# Patient Record
Sex: Female | Born: 1980 | Race: Black or African American | Hispanic: No | Marital: Single | Smoking: Never smoker
Health system: Southern US, Community
[De-identification: ages and names within clinical notes are randomized; demographics above are authoritative.]

## PROBLEM LIST (undated history)

## (undated) ENCOUNTER — Ambulatory Visit: Payer: BC Managed Care – PPO | Source: Home / Self Care

## (undated) ENCOUNTER — Inpatient Hospital Stay (HOSPITAL_COMMUNITY): Payer: Self-pay

## (undated) DIAGNOSIS — K432 Incisional hernia without obstruction or gangrene: Secondary | ICD-10-CM

## (undated) DIAGNOSIS — IMO0002 Reserved for concepts with insufficient information to code with codable children: Secondary | ICD-10-CM

## (undated) DIAGNOSIS — Z8719 Personal history of other diseases of the digestive system: Secondary | ICD-10-CM

## (undated) DIAGNOSIS — K5730 Diverticulosis of large intestine without perforation or abscess without bleeding: Secondary | ICD-10-CM

## (undated) DIAGNOSIS — F329 Major depressive disorder, single episode, unspecified: Secondary | ICD-10-CM

## (undated) DIAGNOSIS — F419 Anxiety disorder, unspecified: Secondary | ICD-10-CM

## (undated) DIAGNOSIS — J189 Pneumonia, unspecified organism: Secondary | ICD-10-CM

## (undated) DIAGNOSIS — N3281 Overactive bladder: Secondary | ICD-10-CM

## (undated) DIAGNOSIS — K5792 Diverticulitis of intestine, part unspecified, without perforation or abscess without bleeding: Secondary | ICD-10-CM

## (undated) HISTORY — PX: CHOLECYSTECTOMY: SHX55

## (undated) HISTORY — PX: HERNIA REPAIR: SHX51

## (undated) HISTORY — DX: Pneumonia, unspecified organism: J18.9

## (undated) HISTORY — DX: Reserved for concepts with insufficient information to code with codable children: IMO0002

## (undated) HISTORY — DX: Anxiety disorder, unspecified: F41.9

## (undated) HISTORY — PX: COLPOSCOPY VULVA W/ BIOPSY: SUR282

---

## 2002-03-11 ENCOUNTER — Emergency Department (HOSPITAL_COMMUNITY): Admission: EM | Admit: 2002-03-11 | Discharge: 2002-03-12 | Payer: Self-pay | Admitting: Emergency Medicine

## 2002-12-22 ENCOUNTER — Emergency Department (HOSPITAL_COMMUNITY): Admission: EM | Admit: 2002-12-22 | Discharge: 2002-12-22 | Payer: Self-pay | Admitting: Emergency Medicine

## 2003-01-02 ENCOUNTER — Emergency Department (HOSPITAL_COMMUNITY): Admission: EM | Admit: 2003-01-02 | Discharge: 2003-01-02 | Payer: Self-pay | Admitting: Emergency Medicine

## 2003-11-10 ENCOUNTER — Inpatient Hospital Stay (HOSPITAL_COMMUNITY): Admission: AD | Admit: 2003-11-10 | Discharge: 2003-11-10 | Payer: Self-pay | Admitting: Obstetrics and Gynecology

## 2003-12-18 ENCOUNTER — Inpatient Hospital Stay (HOSPITAL_COMMUNITY): Admission: AD | Admit: 2003-12-18 | Discharge: 2003-12-20 | Payer: Self-pay | Admitting: Obstetrics and Gynecology

## 2003-12-29 ENCOUNTER — Ambulatory Visit: Admission: RE | Admit: 2003-12-29 | Discharge: 2003-12-29 | Payer: Self-pay | Admitting: Obstetrics and Gynecology

## 2004-01-02 ENCOUNTER — Ambulatory Visit: Admission: RE | Admit: 2004-01-02 | Discharge: 2004-01-02 | Payer: Self-pay | Admitting: Obstetrics and Gynecology

## 2004-01-12 ENCOUNTER — Encounter: Admission: RE | Admit: 2004-01-12 | Discharge: 2004-02-11 | Payer: Self-pay | Admitting: Obstetrics and Gynecology

## 2004-01-31 ENCOUNTER — Other Ambulatory Visit: Admission: RE | Admit: 2004-01-31 | Discharge: 2004-01-31 | Payer: Self-pay | Admitting: Obstetrics and Gynecology

## 2004-03-11 ENCOUNTER — Encounter: Admission: RE | Admit: 2004-03-11 | Discharge: 2004-04-10 | Payer: Self-pay | Admitting: Obstetrics and Gynecology

## 2004-05-11 ENCOUNTER — Encounter: Admission: RE | Admit: 2004-05-11 | Discharge: 2004-06-10 | Payer: Self-pay | Admitting: Obstetrics and Gynecology

## 2004-07-11 ENCOUNTER — Encounter: Admission: RE | Admit: 2004-07-11 | Discharge: 2004-08-10 | Payer: Self-pay | Admitting: Obstetrics and Gynecology

## 2004-08-11 ENCOUNTER — Encounter: Admission: RE | Admit: 2004-08-11 | Discharge: 2004-09-10 | Payer: Self-pay | Admitting: Obstetrics and Gynecology

## 2004-10-11 ENCOUNTER — Encounter: Admission: RE | Admit: 2004-10-11 | Discharge: 2004-11-10 | Payer: Self-pay | Admitting: Obstetrics and Gynecology

## 2008-03-31 DIAGNOSIS — J189 Pneumonia, unspecified organism: Secondary | ICD-10-CM

## 2008-03-31 HISTORY — DX: Pneumonia, unspecified organism: J18.9

## 2008-04-01 ENCOUNTER — Emergency Department (HOSPITAL_COMMUNITY): Admission: EM | Admit: 2008-04-01 | Discharge: 2008-04-01 | Payer: Self-pay | Admitting: Emergency Medicine

## 2011-04-18 NOTE — H&P (Signed)
NAME:  Alexa Gallagher, Alexa Gallagher                        ACCOUNT NO.:  [id number]   MEDICAL RECORD NO.:  [id number]                   PATIENT TYPE:  INP   LOCATION:  9161                                 FACILITY:  [facility]   PHYSICIAN:  Osborn Coho, M.D.                DATE OF BIRTH:  [birth year]   DATE OF ADMISSION:  12/18/2003  DATE OF DISCHARGE:                                HISTORY & PHYSICAL   Ms. Alexa Gallagher is a 30 year old gravida 1, para 0 at 40-3/7 weeks, estimated date  of delivery December 15, 2003 by dates, confirmed by early first trimester  ultrasound at 9 weeks, 6 days.   She presents with contractions throughout the day yesterday, increasing in  frequency and intensity throughout the night.  Contractions are now every 2  to 4 minutes.  The patient reports positive fetal movements and a mucoid  discharge with bloody show but no active bleeding, no rupture of membranes.  She denies any headaches, visual changes or epigastric pain.  Her pregnancy  has been followed by the CNM Service at [facility] and is remarkable for:  1. First trimester x-ray exposure.  2. Narrow outlet.  3. Positive Coxsackie titers.  4. Group B strep negative.   This patient was initially evaluated at the office of [facility] on Apr 21, 2003  at [redacted] weeks gestation.  EDC determined by LMP dates and confirmed with first  trimester ultrasound.  Follow up ultrasounds have all shown appropriate growth.  This patient's  prenatal course has been complicated by early shortness of breath and chest  pain.  She had a cardiac evaluation with a normal EKG, normal chest x-ray  which was all within normal limits.  She has had complaints of constipation  throughout her pregnancy.  Ultrasound evaluation at 18 weeks showed normal  growth with a left ventricular EIF at 28 weeks.  Ultrasound again showed  appropriate growth and fluid, the left ventricular EIF was no longer noted  at that time.  In her pregnancy at 28 weeks the patient was exposed  to hand,  foot and mouth disease, Coxsackie titers were positive.  Ultrasound for  growth following positive titers was within the 60 to 70th percentile with  normal fluid.  At 32 weeks patient was evaluated for preterm contractions.  Her cervix was long and closed at that time; fetal fibrinonectin, GC and  Chlamydia were all negative at 36 weeks.  Culture of the vaginal tract is  negative for group B strep.  The patient has gained 51 pounds with this  pregnancy.  She has been normotensive until the last visit.  The patient and  her mother state that her blood pressure was up.  Today on admission her  blood pressure was 144/95.  PIH labs and urinalysis will be obtained to  evaluate for preeclampsia.   PRENATAL LAB WORK:  Hemoglobin and hematocrit 11.8 and 34.2, platelets  256,000.  Blood type and Rh  A positive, antibody screen negative.  Sickle cell trait negative.  VDRL  nonreactive. Rubella immune.  Hepatitis B surface antigen negative.  HIV  declined.  GC and Chlamydia negative.  Cystic fibrosis testing negative.  Quad screen declined.  At 28 weeks 1 hour Glucola within normal limits.  Hemoglobin 11.1.  Coxsackie titers at term were found to be positive.   OB HISTORY:  Is the present pregnancy.   PAST MEDICAL HISTORY:  Unremarkable.   SURGICAL HISTORY:  Wisdom teeth.   FAMILY HISTORY:  Paternal grandmother with a history of heart disease.  The  patient's father and paternal grandmother with a history of chronic  hypertension.  Mother has a question of a cancer history.  Maternal aunt  with bipolar disease.   GENETIC HISTORY:  The patient's father and first cousin with sickle cell  trait, and paternal grandfather with sickle cell disease.  The patient's  maternal grandmother was a twin.   SOCIAL HISTORY:  Ms. Alexa Gallagher is a 30 year old, married multiracial female.  Her husband Magda Kiel is involved and supportive.  He is currently off  shore in the [employer].  A phone call is being  made for his return as she is  admitted in labor.  The patient denies the use of tobacco, alcohol or  illicit drugs.   ALLERGIES:  There are no known drug allergies.   REVIEW OF SYSTEMS:  The patient is typical of one with a uterine pregnancy  at term.  She is contracting regularly every two to four minutes.  She is  noted to have increased blood pressure and swelling.  She is admitted in  early labor and to be evaluated for preeclampsia.   PHYSICAL EXAMINATION:  VITAL SIGNS:  The patient is afebrile.  Her blood  pressure is 144/9, pulse 110 and respirations 20.  HEENT:  Unremarkable.  HEART:  Regular rate and rhythm.  LUNGS:  Clear.  ABDOMEN:  Gravid in its contour.  Uterine fundus was noted to extend 39 cm  above the level of the pubic symphysis.  Thayer Ohm maneuvers finds the infant  to be in a longitudinal lie, cephalic presentation and the estimated fetal  weight is 7.5 to 8 pounds.  The baseline of the fetal heart rate monitor is  140's, there is averaged long term variability.  There are accelerations  noted .  Though fetal heart rate remains reactive, there are occasional mild  variables noted and late decelerations though not with all contractions.  The patient is contracting every 2 to 4 minutes.  CERVIX:  Digital exam of the cervix finds it to be 1 to 2 cm dilated, 70%  effaced with the cephalic presenting part at -2 station.  EXTREMITIES:  Show 1+ edema.  Deep tendon reflexes are 2+ with no clonus.   ASSESSMENT:  Intrauterine pregnancy at term, early labor.   PLAN:  Admit per Dr. Osborn Coho.  Routine CNM orders.  Obtain PIH labs  and urinalysis to evaluate for preeclampsia.     Rica Koyanagi, C.N.M.               Osborn Coho, M.D.    SDM/MEDQ  D:  12/18/2003  T:  12/18/2003  Job:  [id number]

## 2011-08-27 LAB — URINALYSIS, ROUTINE W REFLEX MICROSCOPIC
Bilirubin Urine: NEGATIVE
Glucose, UA: NEGATIVE
Hgb urine dipstick: NEGATIVE
Ketones, ur: NEGATIVE
Nitrite: NEGATIVE
Protein, ur: NEGATIVE
Specific Gravity, Urine: 1.024
Urobilinogen, UA: 1
pH: 8

## 2011-12-02 NOTE — L&D Delivery Note (Signed)
Patient was C/C/0  and pushed for 60 minutes with epidural.   NSVD of  female infant, Apgars 9/9, weight pending.   The patient had no lacerations. Fundus was firm. EBL was expected. Placenta was delivered intact, true knot noted in cord. Vagina was clear.  Baby was vigorous to bedside.  Alexa Gallagher

## 2012-03-02 ENCOUNTER — Inpatient Hospital Stay (HOSPITAL_COMMUNITY)
Admission: AD | Admit: 2012-03-02 | Discharge: 2012-03-02 | Disposition: A | Discharge status: Home / Self Care | Source: Ambulatory Visit | Attending: Family Medicine | Admitting: Family Medicine

## 2012-03-02 ENCOUNTER — Encounter (HOSPITAL_COMMUNITY): Payer: Self-pay | Admitting: *Deleted

## 2012-03-02 DIAGNOSIS — O26899 Other specified pregnancy related conditions, unspecified trimester: Secondary | ICD-10-CM

## 2012-03-02 DIAGNOSIS — Z3490 Encounter for supervision of normal pregnancy, unspecified, unspecified trimester: Secondary | ICD-10-CM

## 2012-03-02 DIAGNOSIS — R109 Unspecified abdominal pain: Secondary | ICD-10-CM

## 2012-03-02 DIAGNOSIS — O99891 Other specified diseases and conditions complicating pregnancy: Secondary | ICD-10-CM | POA: Insufficient documentation

## 2012-03-02 DIAGNOSIS — N390 Urinary tract infection, site not specified: Secondary | ICD-10-CM | POA: Insufficient documentation

## 2012-03-02 DIAGNOSIS — O2390 Unspecified genitourinary tract infection in pregnancy, unspecified trimester: Secondary | ICD-10-CM | POA: Insufficient documentation

## 2012-03-02 DIAGNOSIS — Z1389 Encounter for screening for other disorder: Secondary | ICD-10-CM

## 2012-03-02 HISTORY — DX: Overactive bladder: N32.81

## 2012-03-02 LAB — WET PREP, GENITAL
Clue Cells Wet Prep HPF POC: NONE SEEN
Trich, Wet Prep: NONE SEEN
Yeast Wet Prep HPF POC: NONE SEEN

## 2012-03-02 LAB — URINALYSIS, ROUTINE W REFLEX MICROSCOPIC
Bilirubin Urine: NEGATIVE
Glucose, UA: NEGATIVE mg/dL
Ketones, ur: NEGATIVE mg/dL
Nitrite: NEGATIVE
Protein, ur: NEGATIVE mg/dL
Specific Gravity, Urine: 1.025 (ref 1.005–1.030)
Urobilinogen, UA: 0.2 mg/dL (ref 0.0–1.0)
pH: 6.5 (ref 5.0–8.0)

## 2012-03-02 LAB — URINE MICROSCOPIC-ADD ON

## 2012-03-02 LAB — POCT PREGNANCY, URINE: Preg Test, Ur: POSITIVE — AB

## 2012-03-02 MED ORDER — CEPHALEXIN 500 MG PO CAPS: 500.0000 mg | ORAL_CAPSULE | Freq: Four times a day (QID) | ORAL | Status: AC

## 2012-03-02 MED ORDER — FLUCONAZOLE 150 MG PO TABS: 150.0000 mg | ORAL_TABLET | Freq: Once | ORAL | Status: AC

## 2012-03-02 NOTE — MAU Provider Note (Signed)
History   Pt presents today c/o lower abd cramping that comes and goes. She also c/o vag dc with odor, urinary frequency and pressure. She thinks she may have a UTI and she thinks she is pregnant. She denies fever, vag bleeding, or severe abd pain.  CSN: [id number]  Arrival date and time: 03/02/12 1440   First Provider Initiated Contact with Patient 03/02/12 1540      Chief Complaint  Patient presents with  . Abdominal Pain   HPI  OB History    Grav Para Term Preterm Abortions TAB SAB Ect Mult Living   2 1 1  0 0 0 0 0 0 1      Past Medical History  Diagnosis Date  . Overactive bladder     Past Surgical History  Procedure Date  . Cholecystectomy     Family History  Problem Relation Age of Onset  . Anesthesia problems Neg Hx     History  Substance Use Topics  . Smoking status: Never Smoker   . Smokeless tobacco: Not on file  . Alcohol Use: No    Allergies: No Known Allergies  No prescriptions prior to admission    Review of Systems  Constitutional: Negative for fever and chills.  Eyes: Negative for blurred vision and double vision.  Respiratory: Negative for cough, hemoptysis, sputum production, shortness of breath and wheezing.   Cardiovascular: Negative for chest pain and palpitations.  Gastrointestinal: Positive for abdominal pain. Negative for nausea, vomiting, diarrhea and constipation.  Genitourinary: Positive for urgency and frequency. Negative for dysuria, hematuria and flank pain.  Neurological: Negative for dizziness and headaches.  Psychiatric/Behavioral: Negative for depression and suicidal ideas.   Physical Exam   Blood pressure 124/86, pulse 95, temperature 98.1 F (36.7 C), temperature source Oral, resp. rate 18, height 5\' 8"  (1.727 m), weight 179 lb 12.8 oz (81.557 kg), last menstrual period 12/24/2011, SpO2 100.00%.  Physical Exam  Nursing note and vitals reviewed. Constitutional: She is oriented to person, place, and time. She appears  well-developed and well-nourished. No distress.  HENT:  Head: Normocephalic and atraumatic.  Eyes: EOM are normal. Pupils are equal, round, and reactive to light.  GI: Soft. She exhibits no distension and no mass. There is no tenderness. There is no rebound and no guarding.  Genitourinary: No bleeding around the vagina. Vaginal discharge found.       Cervix Lg/closed. Thin, greenish vag dc present in the vault.   Neurological: She is alert and oriented to person, place, and time.  Skin: Skin is warm and dry. She is not diaphoretic.  Psychiatric: She has a normal mood and affect. Her behavior is normal. Judgment and thought content normal.    MAU Course  Procedures  Wet prep and GC/Chlamydia cultures done.  Bedside US shows single IUP at 10.0wks with good cardiac activity.  Results for orders placed during the hospital encounter of 03/02/12 (from the past 72 hour(s))  URINALYSIS, ROUTINE W REFLEX MICROSCOPIC     Status: Abnormal   Collection Time   03/02/12  2:52 PM      Component Value Range Comment   Color, Urine YELLOW  YELLOW     APPearance CLEAR  CLEAR     Specific Gravity, Urine 1.025  1.005 - 1.030     pH 6.5  5.0 - 8.0     Glucose, UA NEGATIVE  NEGATIVE (mg/dL)    Hgb urine dipstick TRACE (*) NEGATIVE     Bilirubin Urine NEGATIVE  NEGATIVE  Ketones, ur NEGATIVE  NEGATIVE (mg/dL)    Protein, ur NEGATIVE  NEGATIVE (mg/dL)    Urobilinogen, UA 0.2  0.0 - 1.0 (mg/dL)    Nitrite NEGATIVE  NEGATIVE     Leukocytes, UA SMALL (*) NEGATIVE    URINE MICROSCOPIC-ADD ON     Status: Abnormal   Collection Time   03/02/12  2:52 PM      Component Value Range Comment   Squamous Epithelial / LPF MANY (*) RARE     WBC, UA 3-6  <3 (WBC/hpf)    Bacteria, UA MANY (*) RARE    POCT PREGNANCY, URINE     Status: Abnormal   Collection Time   03/02/12  3:09 PM      Component Value Range Comment   Preg Test, Ur POSITIVE (*) NEGATIVE    WET PREP, GENITAL     Status: Abnormal   Collection Time    03/02/12  3:55 PM      Component Value Range Comment   Yeast Wet Prep HPF POC NONE SEEN  NONE SEEN     Trich, Wet Prep NONE SEEN  NONE SEEN     Clue Cells Wet Prep HPF POC NONE SEEN  NONE SEEN     WBC, Wet Prep HPF POC MODERATE (*) NONE SEEN  MODERATE BACTERIA SEEN   Urine culture sent.  Assessment and Plan  Pain in preg: discussed with pt at length. Will empirically tx for UTI with keflex. Will await cultures. Discussed diet, activity, risks, and precautions.   Clinton Gallant. Urania Pearlman III, DrHSc, MPAS, PA-C  03/02/2012, 3:53 PM

## 2012-03-02 NOTE — MAU Note (Signed)
Pt in c/o bad bilateral lower abdominal sharp pain that started a couple weeks ago, just getting worse.  Reports frequency of urination, hx of frequent utis, denies any burning with urination.   Reports lower back aching with occasional sharp pains.  LMP 12/24/11.  + UPT beginning of March.  Denies any bleeding.  Reports a clear discharge with odor.

## 2012-03-02 NOTE — Discharge Instructions (Signed)
Abdominal Pain During Pregnancy Belly (abdominal) pain is common during pregnancy. Most of the time, it is not a serious problem. Other times, it can be a sign that something is wrong with the pregnancy. Always tell your doctor if you have belly pain. HOME CARE For mild pain:  Do not have sex (intercourse) or put anything in your vagina until you feel better.   Rest until your pain stops. If your pain lasts longer than 1 hour, call your doctor.   Drink clear fluids if you feel sick to your stomach (nauseous).   Do not eat solid food until you feel better.   Only take medicine as told by your doctor.   Keep all doctor visits as told.  GET HELP RIGHT AWAY IF:   You are bleeding, leaking fluid, or pieces of tissue come out of your vagina.   You have more pain or cramping.   You keep throwing up (vomiting).   You have pain when you pee (urinate) or have blood in your pee.   You have a fever.   You do not feel your baby moving as much.   You feel very weak or feel like passing out.   You have trouble breathing, with or without belly pain.   You have a very bad headache and belly pain.   You have fluid leaking from your vagina and belly pain.   You keep having watery poop (diarrhea).   Your belly pain does not go away after resting, or the pain gets worse.  MAKE SURE YOU:   Understand these instructions.   Will watch your condition.   Will get help right away if you are not doing well or get worse.  Document Released: 11/05/2009 Document Revised: 11/06/2011 Document Reviewed: 06/13/2011 [facility] Patient Information 2012 [place], [place].Urinary Tract Infection in Pregnancy A urinary tract infection (UTI) is a bacterial infection of the urinary tract. Infection of the urinary tract can include the ureters, kidneys (pyelonephritis), bladder (cystitis), and urethra (urethritis). All pregnant women should be screened for bacteria in the urinary tract. Identifying and treating a  UTI will decrease the risk of preterm labor and developing more serious infections in both the mother and baby. CAUSES Bacteria germs cause almost all UTIs. There are many factors that can increase your chances of getting a UTI during pregnancy. These include:  Having a short urethra.   Poor toilet and hygiene habits.   Sexual intercourse.   Blockage of urine along the urinary tract.   Problems with the pelvic muscles or nerves.   Diabetes.   Obesity.   Bladder problems after having several children.   Previous history of UTI.  SYMPTOMS   Pain, burning, or a stinging feeling when urinating.   Suddenly feeling the need to urinate right away (urgency).   Loss of bladder control (urinary incontinence).   Frequent urination, more than is common with pregnancy.   Lower abdominal or back discomfort.   Bad smelling urine.   Cloudy urine.   Blood in the urine (hematuria).   Fever.  When the kidneys are infected, the symptoms may be:  Back pain.   Flank pain on the right side more so than the left.   Fever.   Chills.   Nausea.   Vomiting.  DIAGNOSIS   Urine tests.   Additional tests and procedures may include:   Ultrasound of the kidneys, ureters, bladder, and urethra.   Looking in the bladder with a lighted tube (cystoscopy).   Certain  X-ray studies only when absolutely necessary.  Finding out the results of your test Ask when your test results will be ready. Make sure you get your test results. TREATMENT  Antibiotic medicine by mouth.   Antibiotics given through the vein (intravenously), if needed.  HOME CARE INSTRUCTIONS   Take your antibiotics as directed. Finish them even if you start to feel better. Only take medicine as directed by your caregiver.   Drink enough fluids to keep your urine clear or pale yellow.   Do not have sexual intercourse until the infection is gone and your caregiver says it is okay.   Make sure you are tested for UTIs  throughout your pregnancy if you get one. These infections often come back.  Preventing a UTI in the future:  Practice good toilet habits. Always wipe from front to back. Use the tissue only once.   Do not hold your urine. Empty your bladder as soon as possible when the urge comes.   Do not douche or use deodorant sprays.   Wash with soap and warm water around the genital area and the anus.   Empty your bladder before and after sexual intercourse.   Wear underwear with a cotton crotch.   Avoid caffeine and carbonated drinks. They can irritate the bladder.   Drink cranberry juice or take cranberry pills. This may decrease the risk of getting a UTI.   Do not drink alcohol.   Keep all your appointments and tests as scheduled.  SEEK MEDICAL CARE IF:   Your symptoms get worse.   You are still having fevers 2 or more days after treatment begins.   You develop a rash.   You feel that you are having problems with medicines prescribed.   You develop abnormal vaginal discharge.  SEEK IMMEDIATE MEDICAL CARE IF:   You develop back or flank pain.   You develop chills.   You have blood in your urine.   You develop nausea and vomiting.   You develop contractions of your uterus.   You have a gush of fluid from the vagina.  MAKE SURE YOU:   Understand these instructions.   Will watch your condition.   Will get help right away if you are not doing well or get worse.  Document Released: 03/14/2011 Document Revised: 11/06/2011 Document Reviewed: 03/14/2011 [facility] Patient Information 2012 [place], [place].

## 2012-03-02 NOTE — MAU Provider Note (Signed)
Chart reviewed and agree with management and plan.  

## 2012-03-03 LAB — GC/CHLAMYDIA PROBE AMP, GENITAL
Chlamydia, DNA Probe: NEGATIVE
GC Probe Amp, Genital: NEGATIVE

## 2012-03-03 LAB — URINE CULTURE
Colony Count: >=100000
Culture  Setup Time: 201304022059

## 2012-03-22 LAB — OB RESULTS CONSOLE RPR: RPR: NONREACTIVE

## 2012-03-22 LAB — OB RESULTS CONSOLE ABO/RH: RH Type: POSITIVE

## 2012-03-22 LAB — OB RESULTS CONSOLE HEPATITIS B SURFACE ANTIGEN: Hepatitis B Surface Ag: NEGATIVE

## 2012-03-22 LAB — OB RESULTS CONSOLE HIV ANTIBODY (ROUTINE TESTING): HIV: NONREACTIVE

## 2012-03-22 LAB — OB RESULTS CONSOLE RUBELLA ANTIBODY, IGM: Rubella: IMMUNE

## 2012-03-22 LAB — OB RESULTS CONSOLE ANTIBODY SCREEN: Antibody Screen: NEGATIVE

## 2012-07-19 ENCOUNTER — Inpatient Hospital Stay (HOSPITAL_COMMUNITY)
Admission: AD | Admit: 2012-07-19 | Discharge: 2012-07-19 | Disposition: A | Discharge status: Hospice | Payer: Medicaid Other | Source: Ambulatory Visit | Attending: Obstetrics & Gynecology | Admitting: Obstetrics & Gynecology

## 2012-07-19 DIAGNOSIS — O99891 Other specified diseases and conditions complicating pregnancy: Secondary | ICD-10-CM | POA: Insufficient documentation

## 2012-07-19 DIAGNOSIS — K5900 Constipation, unspecified: Secondary | ICD-10-CM | POA: Insufficient documentation

## 2012-07-19 DIAGNOSIS — O0930 Supervision of pregnancy with insufficient antenatal care, unspecified trimester: Secondary | ICD-10-CM | POA: Insufficient documentation

## 2012-07-19 DIAGNOSIS — R109 Unspecified abdominal pain: Secondary | ICD-10-CM | POA: Insufficient documentation

## 2012-07-19 LAB — URINALYSIS, ROUTINE W REFLEX MICROSCOPIC
Bilirubin Urine: NEGATIVE
Glucose, UA: NEGATIVE mg/dL
Hgb urine dipstick: NEGATIVE
Ketones, ur: NEGATIVE mg/dL
Leukocytes, UA: NEGATIVE
Nitrite: NEGATIVE
Protein, ur: NEGATIVE mg/dL
Specific Gravity, Urine: 1.025 (ref 1.005–1.030)
Urobilinogen, UA: 1 mg/dL (ref 0.0–1.0)
pH: 7 (ref 5.0–8.0)

## 2012-07-19 MED ORDER — MAGNESIUM HYDROXIDE 400 MG/5ML PO SUSP: 30.0000 mL | Freq: Every day | ORAL | Status: AC | PRN

## 2012-07-19 MED ORDER — POLYETHYLENE GLYCOL 3350 17 G PO PACK: 17.0000 g | PACK | Freq: Two times a day (BID) | ORAL | Status: DC

## 2012-07-19 NOTE — MAU Note (Signed)
Pt c/o constipation and says she has not had a decent sized BM in one week.  Takes miralax daily for the past 5 days and has very sm bm's daily.  Also c/o intermittant RLQ pain since Saturday.  Has not taken anything for the pain.

## 2012-07-19 NOTE — MAU Provider Note (Signed)
History     CSN: [id number]  Arrival date and time: 07/19/12 1747   First Provider Initiated Contact with Patient 07/19/12 1914      Chief Complaint  Patient presents with  . Constipation  . Abdominal Pain   Pt is a 31 yo G21P1001 female here at 29w and 5 days with constipation, also with no current prenatal provider since [redacted] weeks gestation.  HPI Comments: Pt states stool has in the past week had small stools that still leave her feeling full.  She is also increasingly gassy.    Constipation This is a new problem. The current episode started in the past 7 days. The problem is unchanged. Her stool frequency is 1 time per day. The stool is described as firm. Associated symptoms include abdominal pain, bloating and flatus. Pertinent negatives include no fever. She has tried laxatives for the symptoms. The treatment provided no relief.  Abdominal Pain Associated symptoms include constipation and flatus. Pertinent negatives include no fever or hematuria.   Pertinent prenatal history:  Pt states she had prenatal care until [redacted] weeks gestation, at which point her insurance stopped.  Has applied for pregnancy Medicaid.  Denies hypertension in pregnancy.  Has not had glucose test during this pregnancy.  Denies any complications.    Previous delivery NSVD with no complications other than meconium during delivery.  Past Medical History  Diagnosis Date  . Overactive bladder    Meds: PNV  Past Surgical History  Procedure Date  . Cholecystectomy     Family History  Problem Relation Age of Onset  . Anesthesia problems Neg Hx     History  Substance Use Topics  . Smoking status: Never Smoker   . Smokeless tobacco: Not on file  . Alcohol Use: No    Allergies: No Known Allergies  No prescriptions prior to admission    Review of Systems  Constitutional: Negative for fever.  Respiratory: Negative for cough and shortness of breath.   Cardiovascular: Negative for chest pain.    Gastrointestinal: Positive for abdominal pain, constipation, bloating and flatus. Negative for blood in stool.  Genitourinary: Negative for hematuria.   Physical Exam   Blood pressure 113/67, pulse 84, temperature 98.7 F (37.1 C), temperature source Oral, resp. rate 18, height 5\' 8"  (1.727 m), weight 93.985 kg (207 lb 3.2 oz), last menstrual period 12/24/2011, SpO2 100.00%.  Physical Exam  Constitutional: She is oriented to person, place, and time. She appears well-developed and well-nourished. No distress.  HENT:  Head: Normocephalic and atraumatic.  Eyes: EOM are normal. Right eye exhibits no discharge. Left eye exhibits no discharge.  Neck: Normal range of motion. Neck supple.  Cardiovascular: Normal rate, regular rhythm and normal heart sounds.   Respiratory: Effort normal and breath sounds normal. No respiratory distress.  GI: Soft. She exhibits no distension.       Abdomen mildly tender in RLQ, pt states similar to her "ovary" pain prior to pregnancy.  Musculoskeletal: Normal range of motion.  Neurological: She is alert and oriented to person, place, and time.  Skin: Skin is warm and dry. No rash noted. She is not diaphoretic. No erythema.  Psychiatric: She has a normal mood and affect. Her behavior is normal.   Cervical check per nursing: fingertip, external os closed, internal os long.  MAU Course  Procedures  MDM  Assessment and Plan  Pt is a 31 yo G2P1001 female here at 29w and 5 days with constipation, also with no current prenatal provider since  [redacted] weeks gestation.  # Constipation - ongoing 7 days despite miralax daily for 5 days. - Increase miralax to BID - If no improvement in 1 day, try milk of magnesia x 1 day - If no improvement, return to clinic for enema   # Pregnancy - Pt with no prenatal care to follow-up with [facility], information given - Abdominal pain likely not due to labor, as os is closed  Simone Curia 07/19/2012,  8:47 PM

## 2012-07-19 NOTE — MAU Note (Signed)
Patient states she has been constipated for a while and taking stool softners without much result. Now having abdominal pain and "ovary" pain. Patient reports good fetal movement, no bleeding or leaking. Patient has been getting care in [place] but has not see a MD in 2 months. Husband was with [employer] and was downsized and now has no insurance.

## 2012-07-20 ENCOUNTER — Telehealth: Payer: Self-pay | Admitting: Obstetrics & Gynecology

## 2012-07-20 NOTE — Telephone Encounter (Signed)
Spoke with patient about going to health dept. Patient stated she understood.

## 2012-07-21 NOTE — MAU Provider Note (Signed)
I was consulted on the POC for this patient and agree with above.  [place], CNM 07/21/2012 8:01 AM

## 2012-08-03 ENCOUNTER — Encounter: Payer: Self-pay | Admitting: Obstetrics & Gynecology

## 2012-08-03 ENCOUNTER — Ambulatory Visit (INDEPENDENT_AMBULATORY_CARE_PROVIDER_SITE_OTHER): Admitting: Obstetrics & Gynecology

## 2012-08-03 VITALS — BP 107/75 | Wt 208.0 lb

## 2012-08-03 DIAGNOSIS — Z3480 Encounter for supervision of other normal pregnancy, unspecified trimester: Secondary | ICD-10-CM

## 2012-08-03 DIAGNOSIS — Z23 Encounter for immunization: Secondary | ICD-10-CM

## 2012-08-03 LAB — CBC
HCT: 33.7 % — ABNORMAL LOW (ref 36.0–46.0)
Hemoglobin: 11.5 g/dL — ABNORMAL LOW (ref 12.0–15.0)
MCH: 30.7 pg (ref 26.0–34.0)
MCHC: 34.1 g/dL (ref 30.0–36.0)
MCV: 90.1 fL (ref 78.0–100.0)
Platelets: 247 10*3/uL (ref 150–400)
RBC: 3.74 MIL/uL — ABNORMAL LOW (ref 3.87–5.11)
RDW: 12.5 % (ref 11.5–15.5)
WBC: 6.7 10*3/uL (ref 4.0–10.5)

## 2012-08-03 LAB — OB RESULTS CONSOLE HIV ANTIBODY (ROUTINE TESTING): HIV: NONREACTIVE

## 2012-08-03 LAB — OB RESULTS CONSOLE RPR: RPR: NONREACTIVE

## 2012-08-03 NOTE — Progress Notes (Signed)
Mrs. Alexa Gallagher is here for her first visit here. She did get PNC until 20 weeks in [facility] (Her husband was in the [employer] then). She had declined the Quad screen there. Her AST was slightly elevated so I will recheck it today. She has had intermittent vaginal pains since [redacted] weeks EGA. She denies VB, ROM, or CTXs. She reports good FM. I will get her 28 week labs/vaccine today.

## 2012-08-04 LAB — GLUCOSE TOLERANCE, 1 HOUR (50G) W/O FASTING: Glucose, 1 Hour GTT: 76 mg/dL (ref 70–140)

## 2012-08-04 LAB — HIV ANTIBODY (ROUTINE TESTING W REFLEX): HIV: NONREACTIVE

## 2012-08-04 LAB — RPR

## 2012-08-04 LAB — AST: AST: 21 U/L (ref 0–37)

## 2012-08-04 LAB — ALT: ALT: 17 U/L (ref 0–35)

## 2012-08-06 ENCOUNTER — Ambulatory Visit (HOSPITAL_COMMUNITY)
Admission: RE | Admit: 2012-08-06 | Discharge: 2012-08-06 | Disposition: A | Discharge status: Home / Self Care | Source: Ambulatory Visit | Attending: Obstetrics & Gynecology | Admitting: Obstetrics & Gynecology

## 2012-08-06 DIAGNOSIS — O0930 Supervision of pregnancy with insufficient antenatal care, unspecified trimester: Secondary | ICD-10-CM | POA: Insufficient documentation

## 2012-08-06 DIAGNOSIS — Z3480 Encounter for supervision of other normal pregnancy, unspecified trimester: Secondary | ICD-10-CM

## 2012-08-06 DIAGNOSIS — Z1389 Encounter for screening for other disorder: Secondary | ICD-10-CM | POA: Insufficient documentation

## 2012-08-06 DIAGNOSIS — O358XX0 Maternal care for other (suspected) fetal abnormality and damage, not applicable or unspecified: Secondary | ICD-10-CM | POA: Insufficient documentation

## 2012-08-06 DIAGNOSIS — Z363 Encounter for antenatal screening for malformations: Secondary | ICD-10-CM | POA: Insufficient documentation

## 2012-08-09 ENCOUNTER — Encounter: Payer: Self-pay | Admitting: Obstetrics & Gynecology

## 2012-08-09 DIAGNOSIS — Z3480 Encounter for supervision of other normal pregnancy, unspecified trimester: Secondary | ICD-10-CM | POA: Insufficient documentation

## 2012-08-17 ENCOUNTER — Encounter: Admitting: Obstetrics & Gynecology

## 2012-08-24 ENCOUNTER — Encounter: Admitting: Obstetrics & Gynecology

## 2012-08-30 LAB — OB RESULTS CONSOLE GBS: GBS: NEGATIVE

## 2012-09-28 ENCOUNTER — Encounter (HOSPITAL_COMMUNITY): Payer: Self-pay | Admitting: *Deleted

## 2012-09-28 ENCOUNTER — Telehealth (HOSPITAL_COMMUNITY): Payer: Self-pay | Admitting: *Deleted

## 2012-09-28 NOTE — Telephone Encounter (Signed)
Preadmission screen  

## 2012-10-05 ENCOUNTER — Encounter (HOSPITAL_COMMUNITY)

## 2012-10-05 ENCOUNTER — Inpatient Hospital Stay (HOSPITAL_COMMUNITY)
Admission: RE | Admit: 2012-10-05 | Discharge: 2012-10-08 | DRG: 775 | Disposition: A | Discharge status: Home / Self Care | Payer: Medicaid Other | Source: Ambulatory Visit | Attending: Obstetrics and Gynecology | Admitting: Obstetrics and Gynecology

## 2012-10-05 DIAGNOSIS — Z3480 Encounter for supervision of other normal pregnancy, unspecified trimester: Secondary | ICD-10-CM

## 2012-10-05 DIAGNOSIS — O480 Post-term pregnancy: Principal | ICD-10-CM | POA: Diagnosis present

## 2012-10-05 DIAGNOSIS — O368190 Decreased fetal movements, unspecified trimester, not applicable or unspecified: Secondary | ICD-10-CM | POA: Diagnosis present

## 2012-10-05 LAB — CBC
MCV: 91.8 fL (ref 78.0–100.0)
Platelets: 252 10*3/uL (ref 150–400)
RBC: 3.89 MIL/uL (ref 3.87–5.11)
RDW: 12.3 % (ref 11.5–15.5)
WBC: 7.7 10*3/uL (ref 4.0–10.5)

## 2012-10-05 MED ORDER — MISOPROSTOL 25 MCG QUARTER TABLET
25.0000 ug | ORAL_TABLET | ORAL | Status: DC | PRN
  Administered 2012-10-05 – 2012-10-06 (×3): 25 ug via VAGINAL
  Filled 2012-10-05 (×3): qty 0.25

## 2012-10-05 MED ORDER — LACTATED RINGERS IV SOLN: 500.0000 mL | INTRAVENOUS | Status: DC | PRN

## 2012-10-05 MED ORDER — LACTATED RINGERS IV SOLN
INTRAVENOUS | Status: DC
  Administered 2012-10-06: 16:00:00 via INTRAVENOUS

## 2012-10-05 MED ORDER — SODIUM CHLORIDE 0.9 % IJ SOLN: 3.0000 mL | INTRAMUSCULAR | Status: DC | PRN

## 2012-10-05 MED ORDER — TERBUTALINE SULFATE 1 MG/ML IJ SOLN: 0.2500 mg | Freq: Once | INTRAMUSCULAR | Status: AC | PRN

## 2012-10-05 MED ORDER — CITRIC ACID-SODIUM CITRATE 334-500 MG/5ML PO SOLN: 30.0000 mL | ORAL | Status: DC | PRN

## 2012-10-05 MED ORDER — LIDOCAINE HCL (PF) 1 % IJ SOLN
30.0000 mL | INTRAMUSCULAR | Status: DC | PRN
  Filled 2012-10-05: qty 30

## 2012-10-05 MED ORDER — ACETAMINOPHEN 325 MG PO TABS: 650.0000 mg | ORAL_TABLET | ORAL | Status: DC | PRN

## 2012-10-05 MED ORDER — OXYTOCIN 40 UNITS IN LACTATED RINGERS INFUSION - SIMPLE MED
62.5000 mL/h | INTRAVENOUS | Status: DC
  Administered 2012-10-06: 62.5 mL/h via INTRAVENOUS
  Filled 2012-10-05: qty 1000

## 2012-10-05 MED ORDER — BUTORPHANOL TARTRATE 1 MG/ML IJ SOLN
1.0000 mg | INTRAMUSCULAR | Status: DC | PRN
  Administered 2012-10-06 (×2): 1 mg via INTRAVENOUS
  Filled 2012-10-05 (×2): qty 1

## 2012-10-05 MED ORDER — ZOLPIDEM TARTRATE 5 MG PO TABS
5.0000 mg | ORAL_TABLET | Freq: Every evening | ORAL | Status: DC | PRN
  Administered 2012-10-06: 5 mg via ORAL
  Filled 2012-10-05: qty 1

## 2012-10-05 MED ORDER — OXYCODONE-ACETAMINOPHEN 5-325 MG PO TABS: 1.0000 | ORAL_TABLET | ORAL | Status: DC | PRN

## 2012-10-05 MED ORDER — ONDANSETRON HCL 4 MG/2ML IJ SOLN: 4.0000 mg | Freq: Four times a day (QID) | INTRAMUSCULAR | Status: DC | PRN

## 2012-10-05 MED ORDER — OXYTOCIN BOLUS FROM INFUSION: 500.0000 mL | INTRAVENOUS | Status: DC

## 2012-10-05 MED ORDER — FLEET ENEMA 7-19 GM/118ML RE ENEM: 1.0000 | ENEMA | RECTAL | Status: DC | PRN

## 2012-10-05 MED ORDER — SODIUM CHLORIDE 0.9 % IJ SOLN: 3.0000 mL | Freq: Two times a day (BID) | INTRAMUSCULAR | Status: DC

## 2012-10-05 MED ORDER — IBUPROFEN 600 MG PO TABS
600.0000 mg | ORAL_TABLET | Freq: Four times a day (QID) | ORAL | Status: DC | PRN
  Administered 2012-10-06: 600 mg via ORAL
  Filled 2012-10-05: qty 1

## 2012-10-05 MED ORDER — SODIUM CHLORIDE 0.9 % IV SOLN: 250.0000 mL | INTRAVENOUS | Status: DC | PRN

## 2012-10-06 ENCOUNTER — Encounter (HOSPITAL_COMMUNITY): Payer: Self-pay | Admitting: Anesthesiology

## 2012-10-06 ENCOUNTER — Inpatient Hospital Stay (HOSPITAL_COMMUNITY): Payer: Medicaid Other | Admitting: Anesthesiology

## 2012-10-06 ENCOUNTER — Encounter (HOSPITAL_COMMUNITY): Payer: Self-pay

## 2012-10-06 LAB — RPR: RPR Ser Ql: NONREACTIVE

## 2012-10-06 MED ORDER — SODIUM CHLORIDE 0.9 % IV SOLN
2.0000 g | Freq: Four times a day (QID) | INTRAVENOUS | Status: DC
  Filled 2012-10-06 (×2): qty 2000

## 2012-10-06 MED ORDER — IBUPROFEN 600 MG PO TABS
600.0000 mg | ORAL_TABLET | Freq: Four times a day (QID) | ORAL | Status: DC
  Administered 2012-10-07 – 2012-10-08 (×6): 600 mg via ORAL
  Filled 2012-10-06 (×6): qty 1

## 2012-10-06 MED ORDER — ONDANSETRON HCL 4 MG/2ML IJ SOLN: 4.0000 mg | INTRAMUSCULAR | Status: DC | PRN

## 2012-10-06 MED ORDER — DIPHENHYDRAMINE HCL 50 MG/ML IJ SOLN: 12.5000 mg | INTRAMUSCULAR | Status: DC | PRN

## 2012-10-06 MED ORDER — ONDANSETRON HCL 4 MG PO TABS: 4.0000 mg | ORAL_TABLET | ORAL | Status: DC | PRN

## 2012-10-06 MED ORDER — SIMETHICONE 80 MG PO CHEW: 80.0000 mg | CHEWABLE_TABLET | ORAL | Status: DC | PRN

## 2012-10-06 MED ORDER — FENTANYL 2.5 MCG/ML BUPIVACAINE 1/10 % EPIDURAL INFUSION (WH - ANES)
14.0000 mL/h | INTRAMUSCULAR | Status: DC
  Administered 2012-10-06: 14 mL/h via EPIDURAL
  Filled 2012-10-06 (×2): qty 125

## 2012-10-06 MED ORDER — ZOLPIDEM TARTRATE 5 MG PO TABS: 5.0000 mg | ORAL_TABLET | Freq: Every evening | ORAL | Status: DC | PRN

## 2012-10-06 MED ORDER — PRENATAL MULTIVITAMIN CH: 1.0000 | ORAL_TABLET | Freq: Every day | ORAL | Status: DC

## 2012-10-06 MED ORDER — LACTATED RINGERS IV SOLN
500.0000 mL | Freq: Once | INTRAVENOUS | Status: AC
  Administered 2012-10-06: 500 mL via INTRAVENOUS

## 2012-10-06 MED ORDER — EPHEDRINE 5 MG/ML INJ
10.0000 mg | INTRAVENOUS | Status: DC | PRN
  Filled 2012-10-06 (×2): qty 4

## 2012-10-06 MED ORDER — WITCH HAZEL-GLYCERIN EX PADS: 1.0000 "application " | MEDICATED_PAD | CUTANEOUS | Status: DC | PRN

## 2012-10-06 MED ORDER — TERBUTALINE SULFATE 1 MG/ML IJ SOLN: 0.2500 mg | Freq: Once | INTRAMUSCULAR | Status: DC | PRN

## 2012-10-06 MED ORDER — PRENATAL MULTIVITAMIN CH
1.0000 | ORAL_TABLET | Freq: Every day | ORAL | Status: DC
  Administered 2012-10-07 – 2012-10-08 (×2): 1 via ORAL
  Filled 2012-10-06 (×2): qty 1

## 2012-10-06 MED ORDER — LIDOCAINE HCL (PF) 1 % IJ SOLN
INTRAMUSCULAR | Status: DC | PRN
  Administered 2012-10-06 (×4): 4 mL

## 2012-10-06 MED ORDER — DIPHENHYDRAMINE HCL 25 MG PO CAPS: 25.0000 mg | ORAL_CAPSULE | Freq: Four times a day (QID) | ORAL | Status: DC | PRN

## 2012-10-06 MED ORDER — LANOLIN HYDROUS EX OINT: TOPICAL_OINTMENT | CUTANEOUS | Status: DC | PRN

## 2012-10-06 MED ORDER — OXYTOCIN 40 UNITS IN LACTATED RINGERS INFUSION - SIMPLE MED
1.0000 m[IU]/min | INTRAVENOUS | Status: DC
  Administered 2012-10-06: 2 m[IU]/min via INTRAVENOUS

## 2012-10-06 MED ORDER — SENNOSIDES-DOCUSATE SODIUM 8.6-50 MG PO TABS
2.0000 | ORAL_TABLET | Freq: Every day | ORAL | Status: DC
  Administered 2012-10-06 – 2012-10-07 (×2): 2 via ORAL

## 2012-10-06 MED ORDER — DIBUCAINE 1 % RE OINT: 1.0000 "application " | TOPICAL_OINTMENT | RECTAL | Status: DC | PRN

## 2012-10-06 MED ORDER — TETANUS-DIPHTH-ACELL PERTUSSIS 5-2.5-18.5 LF-MCG/0.5 IM SUSP: 0.5000 mL | Freq: Once | INTRAMUSCULAR | Status: DC

## 2012-10-06 MED ORDER — INFLUENZA VIRUS VACC SPLIT PF IM SUSP: 0.5000 mL | INTRAMUSCULAR | Status: AC

## 2012-10-06 MED ORDER — OXYCODONE-ACETAMINOPHEN 5-325 MG PO TABS
1.0000 | ORAL_TABLET | ORAL | Status: DC | PRN
Start: 2012-10-06 — End: 2012-10-08
  Administered 2012-10-06: 2 via ORAL
  Administered 2012-10-07 – 2012-10-08 (×4): 1 via ORAL
  Filled 2012-10-06: qty 2
  Filled 2012-10-06 (×4): qty 1

## 2012-10-06 MED ORDER — PHENYLEPHRINE 40 MCG/ML (10ML) SYRINGE FOR IV PUSH (FOR BLOOD PRESSURE SUPPORT)
80.0000 ug | PREFILLED_SYRINGE | INTRAVENOUS | Status: DC | PRN
  Filled 2012-10-06: qty 5

## 2012-10-06 MED ORDER — BENZOCAINE-MENTHOL 20-0.5 % EX AERO
1.0000 "application " | INHALATION_SPRAY | CUTANEOUS | Status: DC | PRN
  Administered 2012-10-06 – 2012-10-08 (×2): 1 via TOPICAL
  Filled 2012-10-06 (×2): qty 56

## 2012-10-06 MED ORDER — EPHEDRINE 5 MG/ML INJ: 10.0000 mg | INTRAVENOUS | Status: DC | PRN

## 2012-10-06 MED ORDER — GENTAMICIN SULFATE 40 MG/ML IJ SOLN
150.0000 mg | Freq: Three times a day (TID) | INTRAVENOUS | Status: DC
  Filled 2012-10-06 (×2): qty 3.75

## 2012-10-06 NOTE — Progress Notes (Signed)
Dr Junie Bame notified of pt progress, FHR tracing with periods of minimal variabiity, decels, and UCs. Requesting POC

## 2012-10-06 NOTE — H&P (Signed)
31 y.o. [redacted]w[redacted]d  G2P1001 comes in for IOL for PPD.  Otherwise has good fetal movement and no bleeding.  Past Medical History  Diagnosis Date  . Overactive bladder   . Abnormal Pap smear   . Pneumonia   . Anxiety     Past Surgical History  Procedure Date  . Cholecystectomy   . Colposcopy vulva w/ biopsy     abnormal pap.    OB History    Grav Para Term Preterm Abortions TAB SAB Ect Mult Living   2 1 1  0 0 0 0 0 0 1     # Outc Date GA Lbr Len/2nd Wgt Sex Del Anes PTL Lv   1 TRM 1/05 [redacted]w[redacted]d 17:00 7lb(3.175kg) F SVD EPI  Yes   2 CUR               History   Social History  . Marital Status: Married    Spouse Name: N/A    Number of Children: N/A  . Years of Education: N/A   Occupational History  . Not on file.   Social History Main Topics  . Smoking status: Never Smoker   . Smokeless tobacco: Not on file  . Alcohol Use: No  . Drug Use: No  . Sexually Active: Yes    Birth Control/ Protection: None   Other Topics Concern  . Not on file   Social History Narrative  . No narrative on file   Review of patient's allergies indicates no known allergies.   Prenatal Course:  Transfer of care at 35 weeks, otherwise uncomplicated, nl 1hr gct, no record of genetic screening done.  Filed Vitals:   10/06/12 1035  BP:   Pulse:   Temp:   Resp: 18     Lungs/Cor:  NAD Abdomen:  soft, gravid Ex:  no cords, erythema SVE:  1/60/-3 at ad,ossopm FHTs:  140, good STV, NST R Toco:  irreg   A/P   Admit for induction of labor for PDD Dr. Dareen Piano started pt on cytotec overnight, minimal change so far, s/p cytotec x 3, however pt very uncomfortable with contractions, not all being picked up.  IV pain medication prn, epidural allowed when pt desires.  GBS Neg  Prenatal Transfer Tool  Maternal Diabetes: No Genetic Screening: Declined Maternal Ultrasounds/Referrals: Normal Fetal Ultrasounds or other Referrals:  None Maternal Substance Abuse:  No Significant Maternal Medications:   None Significant Maternal Lab Results: None     Alexa Gallagher

## 2012-10-06 NOTE — Anesthesia Preprocedure Evaluation (Signed)
Anesthesia Evaluation  Patient identified by MRN, date of birth, ID band Patient awake    Reviewed: Allergy & Precautions, H&P , NPO status , Patient's Chart, lab work & pertinent test results, reviewed documented beta blocker date and time   History of Anesthesia Complications Negative for: history of anesthetic complications  Airway Mallampati: III TM Distance: >3 FB Neck ROM: full    Dental  (+) Teeth Intact   Pulmonary pneumonia - (2010),  breath sounds clear to auscultation        Cardiovascular negative cardio ROS  Rhythm:regular Rate:Normal     Neuro/Psych PSYCHIATRIC DISORDERS (anxiety) negative neurological ROS     GI/Hepatic negative GI ROS, Neg liver ROS,   Endo/Other  negative endocrine ROS  Renal/GU negative Renal ROS Bladder dysfunction      Musculoskeletal   Abdominal   Peds  Hematology negative hematology ROS (+)   Anesthesia Other Findings   Reproductive/Obstetrics (+) Pregnancy                           Anesthesia Physical Anesthesia Plan  ASA: II  Anesthesia Plan: Epidural   Post-op Pain Management:    Induction:   Airway Management Planned:   Additional Equipment:   Intra-op Plan:   Post-operative Plan:   Informed Consent: I have reviewed the patients History and Physical, chart, labs and discussed the procedure including the risks, benefits and alternatives for the proposed anesthesia with the patient or authorized representative who has indicated his/her understanding and acceptance.     Plan Discussed with:   Anesthesia Plan Comments:         Anesthesia Quick Evaluation

## 2012-10-06 NOTE — Anesthesia Procedure Notes (Signed)
Epidural Patient location during procedure: OB Start time: 10/06/2012 12:55 PM  Staffing Performed by: anesthesiologist   Preanesthetic Checklist Completed: patient identified, site marked, surgical consent, pre-op evaluation, timeout performed, IV checked, risks and benefits discussed and monitors and equipment checked  Epidural Patient position: sitting Prep: site prepped and draped and DuraPrep Patient monitoring: continuous pulse ox and blood pressure Approach: midline Injection technique: LOR air  Needle:  Needle type: Tuohy  Needle gauge: 17 G Needle length: 9 cm and 9 Needle insertion depth: 6 cm Catheter type: closed end flexible Catheter size: 19 Gauge Catheter at skin depth: 11 cm Test dose: negative  Assessment Events: blood not aspirated, injection not painful, no injection resistance, negative IV test and no paresthesia  Additional Notes Discussed risk of headache, infection, bleeding, nerve injury and failed or incomplete block.  Patient voices understanding and wishes to proceed. Reason for block:procedure for pain

## 2012-10-06 NOTE — Progress Notes (Signed)
Delivery of live viable female by Dr Callahan. APGARS 9,9  

## 2012-10-07 LAB — CBC
Hemoglobin: 10.9 g/dL — ABNORMAL LOW (ref 12.0–15.0)
MCH: 31.6 pg (ref 26.0–34.0)
MCV: 91.3 fL (ref 78.0–100.0)
RBC: 3.45 MIL/uL — ABNORMAL LOW (ref 3.87–5.11)

## 2012-10-07 NOTE — Anesthesia Postprocedure Evaluation (Signed)
  Anesthesia Post-op Note  Patient: Alexa Gallagher  Procedure(s) Performed: * No procedures listed *  Patient Location: Mother/Baby  Anesthesia Type:Epidural  Level of Consciousness: awake  Airway and Oxygen Therapy: Patient Spontanous Breathing  Post-op Pain: none  Post-op Assessment: Patient's Cardiovascular Status Stable, Respiratory Function Stable, Patent Airway, No signs of Nausea or vomiting, Adequate PO intake, Pain level controlled, No headache, No backache, No residual numbness and No residual motor weakness  Post-op Vital Signs: Reviewed and stable  Complications: No apparent anesthesia complications

## 2012-10-07 NOTE — Progress Notes (Signed)
UR Chart review completed.  

## 2012-10-07 NOTE — Progress Notes (Signed)
PPD#1 Pt without c/o. Will do circ in office. Lochia-wnl VSSAF IMP/ doing well. Plan/ routine care.

## 2012-10-08 NOTE — Discharge Summary (Signed)
Obstetric Discharge Summary Reason for Admission: induction of labor Prenatal Procedures: none Intrapartum Procedures: spontaneous vaginal delivery Postpartum Procedures: none Complications-Operative and Postpartum: none Hemoglobin  Date Value Range Status  10/07/2012 10.9* 12.0 - 15.0 g/dL Final  1/61/0960 45.4   Final     HCT  Date Value Range Status  10/07/2012 31.5* 36.0 - 46.0 % Final  03/22/2012 38   Final    Physical Exam:  General: alert Lochia: appropriate Uterine Fundus: firm  Discharge Diagnoses: Post-date pregnancy delivered.  Discharge Information: Date: 10/08/2012 Activity: pelvic rest Diet: routine Medications: PNV and Ibuprofen Condition: stable Instructions: refer to practice specific booklet Discharge to: home Follow-up Information    Follow up with CALLAHAN, SIDNEY, DO. Schedule an appointment as soon as possible for a visit in 4 weeks.   Contact information:   [address], [address]    [place] [address] [phone]          Newborn Data: Live born female  Birth Weight: 8 lb 8.5 oz (3870 g) APGAR: 9, 9  Home with mother.  Alexa Gallagher D 10/08/2012, 10:08 AM

## 2012-10-08 NOTE — Progress Notes (Signed)
Patient was referred for history of depression/anxiety.  * Referral screened out by Clinical Social Worker because none of the following criteria appear to apply:  ~ History of anxiety/depression during this pregnancy, or of post-partum depression.  ~ Diagnosis of anxiety and/or depression within last 3 years  ~ History of depression due to pregnancy loss/loss of child  OR  * Patient's symptoms currently being treated with medication and/or therapy.  Please contact the Clinical Social Worker if needs arise, or by the patient's request.  Pt told Sw that she was never diagnosed with anxiety disorder.  

## 2013-07-29 ENCOUNTER — Encounter (INDEPENDENT_AMBULATORY_CARE_PROVIDER_SITE_OTHER): Payer: Self-pay | Admitting: Surgery

## 2013-08-01 DIAGNOSIS — K432 Incisional hernia without obstruction or gangrene: Secondary | ICD-10-CM

## 2013-08-01 HISTORY — DX: Incisional hernia without obstruction or gangrene: K43.2

## 2013-08-05 ENCOUNTER — Encounter (INDEPENDENT_AMBULATORY_CARE_PROVIDER_SITE_OTHER): Payer: Self-pay | Admitting: Surgery

## 2013-08-05 ENCOUNTER — Ambulatory Visit (INDEPENDENT_AMBULATORY_CARE_PROVIDER_SITE_OTHER): Payer: Medicaid Other | Admitting: Surgery

## 2013-08-05 VITALS — BP 128/76 | HR 64 | Temp 98.6°F | Resp 14 | Ht 68.0 in | Wt 196.8 lb

## 2013-08-05 DIAGNOSIS — K432 Incisional hernia without obstruction or gangrene: Secondary | ICD-10-CM

## 2013-08-05 NOTE — Progress Notes (Signed)
Subjective:     Patient ID: Alexa Gallagher, female   DOB: [birth year], 32 y.o.   MRN: [id number]  HPI She is referred to me by Dr. Philip Aspen for evaluation of a hernia above the umbilicus. She is currently pregnant. She noticed a hernia bulging more significant concern. She actually noticed a hernia during her first pregnancy. She has no further symptoms and no pain  Review of Systems     Objective:   Physical Exam On exam, there is a small easily reducible hernia at an old incision above the umbilicus. She is nontender    Assessment:     Incisional hernia     Plan:     I would recommend repair of the hernia with mesh after she has delivered. I think her chance of incarceration is quite small. I spent a diagnosis to her in detail. She will call me back and make an appointment after delivery. We can certainly change this plan should she become symptomatically

## 2013-11-01 ENCOUNTER — Encounter (HOSPITAL_COMMUNITY): Payer: Self-pay | Admitting: *Deleted

## 2013-11-01 ENCOUNTER — Inpatient Hospital Stay (HOSPITAL_COMMUNITY)
Admission: AD | Admit: 2013-11-01 | Discharge: 2013-11-01 | Disposition: A | Discharge status: Home / Self Care | Payer: Medicaid Other | Source: Ambulatory Visit | Attending: Obstetrics and Gynecology | Admitting: Obstetrics and Gynecology

## 2013-11-01 DIAGNOSIS — K529 Noninfective gastroenteritis and colitis, unspecified: Secondary | ICD-10-CM

## 2013-11-01 DIAGNOSIS — R42 Dizziness and giddiness: Secondary | ICD-10-CM | POA: Insufficient documentation

## 2013-11-01 DIAGNOSIS — R197 Diarrhea, unspecified: Secondary | ICD-10-CM | POA: Insufficient documentation

## 2013-11-01 DIAGNOSIS — K5289 Other specified noninfective gastroenteritis and colitis: Secondary | ICD-10-CM

## 2013-11-01 DIAGNOSIS — O212 Late vomiting of pregnancy: Secondary | ICD-10-CM | POA: Insufficient documentation

## 2013-11-01 DIAGNOSIS — O99891 Other specified diseases and conditions complicating pregnancy: Secondary | ICD-10-CM | POA: Insufficient documentation

## 2013-11-01 DIAGNOSIS — O368390 Maternal care for abnormalities of the fetal heart rate or rhythm, unspecified trimester, not applicable or unspecified: Secondary | ICD-10-CM | POA: Insufficient documentation

## 2013-11-01 LAB — COMPREHENSIVE METABOLIC PANEL
ALT: 20 U/L (ref 0–35)
Alkaline Phosphatase: 87 U/L (ref 39–117)
BUN: 7 mg/dL (ref 6–23)
CO2: 22 mEq/L (ref 19–32)
Chloride: 100 mEq/L (ref 96–112)
GFR calc Af Amer: >90 mL/min (ref >90)
Glucose, Bld: 77 mg/dL (ref 70–99)
Potassium: 3.9 mEq/L (ref 3.5–5.1)
Total Bilirubin: 0.2 mg/dL — ABNORMAL LOW (ref 0.3–1.2)

## 2013-11-01 LAB — URINALYSIS, ROUTINE W REFLEX MICROSCOPIC
Glucose, UA: NEGATIVE mg/dL
Ketones, ur: 15 mg/dL — AB
pH: 6 (ref 5.0–8.0)

## 2013-11-01 LAB — CBC
HCT: 32.3 % — ABNORMAL LOW (ref 36.0–46.0)
Hemoglobin: 11.2 g/dL — ABNORMAL LOW (ref 12.0–15.0)
MCHC: 34.7 g/dL (ref 30.0–36.0)
RBC: 3.63 MIL/uL — ABNORMAL LOW (ref 3.87–5.11)
WBC: 9.4 10*3/uL (ref 4.0–10.5)

## 2013-11-01 LAB — URINE MICROSCOPIC-ADD ON

## 2013-11-01 MED ORDER — ACETAMINOPHEN 500 MG PO TABS
1000.0000 mg | ORAL_TABLET | Freq: Once | ORAL | Status: AC
  Administered 2013-11-01: 1000 mg via ORAL
  Filled 2013-11-01: qty 2

## 2013-11-01 MED ORDER — DEXTROSE IN LACTATED RINGERS 5 % IV SOLN
INTRAVENOUS | Status: DC
  Administered 2013-11-01: 19:00:00 via INTRAVENOUS

## 2013-11-01 NOTE — MAU Provider Note (Signed)
History     CSN: [id number]  Arrival date and time: 11/01/13 1721   First Provider Initiated Contact with Patient 11/01/13 1822      Chief Complaint  Patient presents with  . Dizziness   HPI  Ms. Alexa Gallagher is a 32 y.o. female G3P2002 at [redacted]w[redacted]d who presents with dizziness, HA,  nausea/ vomiting and diarrhea. The N/V/D and has resolved, she had several episodes yesterday but nothing today. Currently she complains of a HA that she rates 6/10 and dizziness. She has not taken anything for the HA today.   OB History   Grav Para Term Preterm Abortions TAB SAB Ect Mult Living   3 2 2  0 0 0 0 0 0 2      Past Medical History  Diagnosis Date  . Overactive bladder   . Abnormal Pap smear   . Pneumonia   . Anxiety     Past Surgical History  Procedure Laterality Date  . Cholecystectomy    . Colposcopy vulva w/ biopsy      abnormal pap.    Family History  Problem Relation Age of Onset  . Anesthesia problems Neg Hx   . Cancer Mother     cervical cancer  . Anxiety disorder Mother   . Hypertension Father   . Diabetes Father     boreline  . Alcohol abuse Maternal Grandmother   . Depression Maternal Grandmother   . Anxiety disorder Maternal Grandmother   . Heart disease Paternal Grandmother   . Sickle cell anemia Paternal Grandfather     History  Substance Use Topics  . Smoking status: Never Smoker   . Smokeless tobacco: Not on file  . Alcohol Use: No    Allergies: No Known Allergies  Prescriptions prior to admission  Medication Sig Dispense Refill  . calcium carbonate (TUMS - DOSED IN MG ELEMENTAL CALCIUM) 500 MG chewable tablet Chew 2 tablets by mouth daily as needed. For heartburn      . magnesium hydroxide (MILK OF MAGNESIA) 400 MG/5ML suspension Take 30 mLs by mouth as needed. constipation      . Prenatal Vit-Fe Fumarate-FA (PRENATAL MULTIVITAMIN) TABS Take 1 tablet by mouth daily. Alternate as needed with Flintstones       Results for orders placed during  the hospital encounter of 11/01/13 (from the past 24 hour(s))  URINALYSIS, ROUTINE W REFLEX MICROSCOPIC     Status: Abnormal   Collection Time    11/01/13  5:40 PM      Result Value Range   Color, Urine AMBER (*) YELLOW   APPearance HAZY (*) CLEAR   Specific Gravity, Urine >1.030 (*) 1.005 - 1.030   pH 6.0  5.0 - 8.0   Glucose, UA NEGATIVE  NEGATIVE mg/dL   Hgb urine dipstick TRACE (*) NEGATIVE   Bilirubin Urine NEGATIVE  NEGATIVE   Ketones, ur 15 (*) NEGATIVE mg/dL   Protein, ur 30 (*) NEGATIVE mg/dL   Urobilinogen, UA 4.0 (*) 0.0 - 1.0 mg/dL   Nitrite NEGATIVE  NEGATIVE   Leukocytes, UA TRACE (*) NEGATIVE  URINE MICROSCOPIC-ADD ON     Status: Abnormal   Collection Time    11/01/13  5:40 PM      Result Value Range   Squamous Epithelial / LPF FEW (*) RARE   WBC, UA 3-6  <3 WBC/hpf   RBC / HPF 3-6  <3 RBC/hpf   Bacteria, UA FEW (*) RARE   Urine-Other MUCOUS PRESENT    CBC  Status: Abnormal   Collection Time    11/01/13  6:38 PM      Result Value Range   WBC 9.4  4.0 - 10.5 K/uL   RBC 3.63 (*) 3.87 - 5.11 MIL/uL   Hemoglobin 11.2 (*) 12.0 - 15.0 g/dL   HCT 40.9 (*) 81.1 - 91.4 %   MCV 89.0  78.0 - 100.0 fL   MCH 30.9  26.0 - 34.0 pg   MCHC 34.7  30.0 - 36.0 g/dL   RDW 78.2  95.6 - 21.3 %   Platelets 237  150 - 400 K/uL  COMPREHENSIVE METABOLIC PANEL     Status: Abnormal   Collection Time    11/01/13  6:38 PM      Result Value Range   Sodium 133 (*) 135 - 145 mEq/L   Potassium 3.9  3.5 - 5.1 mEq/L   Chloride 100  96 - 112 mEq/L   CO2 22  19 - 32 mEq/L   Glucose, Bld 77  70 - 99 mg/dL   BUN 7  6 - 23 mg/dL   Creatinine, Ser 0.86  0.50 - 1.10 mg/dL   Calcium 8.1 (*) 8.4 - 10.5 mg/dL   Total Protein 7.3  6.0 - 8.3 g/dL   Albumin 2.8 (*) 3.5 - 5.2 g/dL   AST 38 (*) 0 - 37 U/L   ALT 20  0 - 35 U/L   Alkaline Phosphatase 87  39 - 117 U/L   Total Bilirubin 0.2 (*) 0.3 - 1.2 mg/dL   GFR calc non Af Amer >90  >90 mL/min   GFR calc Af Amer >90  >90 mL/min    Review  of Systems  Constitutional: Positive for chills. Negative for fever.  Gastrointestinal: Positive for abdominal pain. Negative for nausea, vomiting, diarrhea and constipation.  Genitourinary: Negative for dysuria, urgency, frequency and hematuria.       No vaginal discharge. No vaginal bleeding. No dysuria.   Musculoskeletal: Positive for back pain.   Physical Exam   Blood pressure 101/64, pulse 102, temperature 98.5 F (36.9 C), temperature source Oral, resp. rate 20, height 5\' 6"  (1.676 m), weight 93.895 kg (207 lb), SpO2 100.00%.  Physical Exam  Constitutional: She is oriented to person, place, and time. She appears well-developed and well-nourished. No distress.  HENT:  Head: Normocephalic.  Neck: Neck supple.  Respiratory: Effort normal.  Musculoskeletal: Normal range of motion.  Neurological: She is alert and oriented to person, place, and time.  Skin: Skin is warm. She is not diaphoretic.  Psychiatric: Her behavior is normal.   Fetal Tracing:  Baseline: 145 bpm  Variability: Moderate  Accelerations: 10x10 Decelerations: variable   Toco: None    MAU Course  Procedures None  MDM UA NST CBC CMET D5LR bolus Tylenol 1 gram po  Orthostatic vital signs.  Consulted with Dr. Henderson Cloud  Pt rates her headache 3/10 following tylenol Pt up to the bathroom without difficulty, tolerated PO saltine crackers. Pt feels better and feels ready to go home.    Assessment and Plan   A: 1. Gastroenteritis   2. Dizziness     P: Discharge home Increase fluids, preferably Gatorade or smart water BRAT diet discussed Return to MAU as needed, if symptoms worsen Follow up with Dr. Kittie Plater office as scheduled  Iona Hansen Rasch, NP  11/01/2013, 8:20 PM

## 2013-11-01 NOTE — MAU Note (Signed)
Was vomiting and had diarrhea yesterday. Stopped last night.  Dr did call in something for the nausea.  No one else at home was/ is sick.  Just feels light headed today, urine is dark.  Admits to limited fluid intake today.

## 2013-11-02 LAB — URINE CULTURE: Colony Count: 40000

## 2013-12-01 NOTE — L&D Delivery Note (Signed)
After admission the pt rapidly progressed to C/C/+2. She pushed for 15 min and had a SVD of one live viable black female infant over an intact perineum in the ROA position. Placenta S/I. EBL-400cc.

## 2014-02-06 ENCOUNTER — Encounter (HOSPITAL_COMMUNITY): Payer: Self-pay | Admitting: *Deleted

## 2014-02-06 ENCOUNTER — Inpatient Hospital Stay (HOSPITAL_COMMUNITY)
Admission: AD | Admit: 2014-02-06 | Discharge: 2014-02-08 | DRG: 775 | Disposition: A | Discharge status: Home / Self Care | Payer: Medicaid Other | Source: Ambulatory Visit | Attending: Obstetrics and Gynecology | Admitting: Obstetrics and Gynecology

## 2014-02-06 DIAGNOSIS — O9989 Other specified diseases and conditions complicating pregnancy, childbirth and the puerperium: Principal | ICD-10-CM

## 2014-02-06 DIAGNOSIS — Z3480 Encounter for supervision of other normal pregnancy, unspecified trimester: Secondary | ICD-10-CM

## 2014-02-06 DIAGNOSIS — O99892 Other specified diseases and conditions complicating childbirth: Principal | ICD-10-CM | POA: Diagnosis present

## 2014-02-06 DIAGNOSIS — IMO0001 Reserved for inherently not codable concepts without codable children: Secondary | ICD-10-CM

## 2014-02-06 DIAGNOSIS — K429 Umbilical hernia without obstruction or gangrene: Secondary | ICD-10-CM | POA: Diagnosis present

## 2014-02-06 DIAGNOSIS — K432 Incisional hernia without obstruction or gangrene: Secondary | ICD-10-CM

## 2014-02-06 HISTORY — DX: Incisional hernia without obstruction or gangrene: K43.2

## 2014-02-06 LAB — OB RESULTS CONSOLE RPR: RPR: NONREACTIVE

## 2014-02-06 LAB — CBC
HCT: 32.1 % — ABNORMAL LOW (ref 36.0–46.0)
Hemoglobin: 11.3 g/dL — ABNORMAL LOW (ref 12.0–15.0)
MCH: 31.5 pg (ref 26.0–34.0)
MCHC: 35.2 g/dL (ref 30.0–36.0)
MCV: 89.4 fL (ref 78.0–100.0)
PLATELETS: 230 10*3/uL (ref 150–400)
RBC: 3.59 MIL/uL — ABNORMAL LOW (ref 3.87–5.11)
RDW: 12.6 % (ref 11.5–15.5)
WBC: 9.7 10*3/uL (ref 4.0–10.5)

## 2014-02-06 LAB — OB RESULTS CONSOLE GC/CHLAMYDIA
CHLAMYDIA, DNA PROBE: NEGATIVE
Gonorrhea: NEGATIVE

## 2014-02-06 LAB — OB RESULTS CONSOLE HIV ANTIBODY (ROUTINE TESTING): HIV: NONREACTIVE

## 2014-02-06 LAB — RPR: RPR: NONREACTIVE

## 2014-02-06 MED ORDER — OXYTOCIN BOLUS FROM INFUSION: 500.0000 mL | INTRAVENOUS | Status: DC

## 2014-02-06 MED ORDER — DIPHENHYDRAMINE HCL 50 MG/ML IJ SOLN: 12.5000 mg | INTRAMUSCULAR | Status: DC | PRN

## 2014-02-06 MED ORDER — TETANUS-DIPHTH-ACELL PERTUSSIS 5-2.5-18.5 LF-MCG/0.5 IM SUSP: 0.5000 mL | Freq: Once | INTRAMUSCULAR | Status: DC

## 2014-02-06 MED ORDER — LACTATED RINGERS IV SOLN: 500.0000 mL | Freq: Once | INTRAVENOUS | Status: DC

## 2014-02-06 MED ORDER — IBUPROFEN 600 MG PO TABS
600.0000 mg | ORAL_TABLET | Freq: Four times a day (QID) | ORAL | Status: DC
  Administered 2014-02-06 – 2014-02-08 (×7): 600 mg via ORAL
  Filled 2014-02-06 (×7): qty 1

## 2014-02-06 MED ORDER — ACETAMINOPHEN 325 MG PO TABS: 650.0000 mg | ORAL_TABLET | ORAL | Status: DC | PRN

## 2014-02-06 MED ORDER — DIBUCAINE 1 % RE OINT: 1.0000 "application " | TOPICAL_OINTMENT | RECTAL | Status: DC | PRN

## 2014-02-06 MED ORDER — PHENYLEPHRINE 40 MCG/ML (10ML) SYRINGE FOR IV PUSH (FOR BLOOD PRESSURE SUPPORT)
80.0000 ug | PREFILLED_SYRINGE | INTRAVENOUS | Status: DC | PRN
  Filled 2014-02-06: qty 2

## 2014-02-06 MED ORDER — IBUPROFEN 600 MG PO TABS
600.0000 mg | ORAL_TABLET | Freq: Four times a day (QID) | ORAL | Status: DC | PRN
  Administered 2014-02-06: 600 mg via ORAL
  Filled 2014-02-06: qty 1

## 2014-02-06 MED ORDER — EPHEDRINE 5 MG/ML INJ
10.0000 mg | INTRAVENOUS | Status: DC | PRN
  Filled 2014-02-06: qty 2

## 2014-02-06 MED ORDER — OXYCODONE-ACETAMINOPHEN 5-325 MG PO TABS
1.0000 | ORAL_TABLET | ORAL | Status: DC | PRN
  Administered 2014-02-06: 2 via ORAL
  Filled 2014-02-06: qty 2

## 2014-02-06 MED ORDER — LIDOCAINE HCL (PF) 1 % IJ SOLN
30.0000 mL | INTRAMUSCULAR | Status: DC | PRN
  Filled 2014-02-06: qty 30

## 2014-02-06 MED ORDER — OXYCODONE-ACETAMINOPHEN 5-325 MG PO TABS
1.0000 | ORAL_TABLET | ORAL | Status: DC | PRN
  Administered 2014-02-06 – 2014-02-07 (×5): 2 via ORAL
  Administered 2014-02-07: 1 via ORAL
  Administered 2014-02-08: 2 via ORAL
  Filled 2014-02-06 (×5): qty 2
  Filled 2014-02-06: qty 1
  Filled 2014-02-06 (×2): qty 2

## 2014-02-06 MED ORDER — ZOLPIDEM TARTRATE 5 MG PO TABS: 5.0000 mg | ORAL_TABLET | Freq: Every evening | ORAL | Status: DC | PRN

## 2014-02-06 MED ORDER — SIMETHICONE 80 MG PO CHEW: 80.0000 mg | CHEWABLE_TABLET | ORAL | Status: DC | PRN

## 2014-02-06 MED ORDER — WITCH HAZEL-GLYCERIN EX PADS: 1.0000 "application " | MEDICATED_PAD | CUTANEOUS | Status: DC | PRN

## 2014-02-06 MED ORDER — SENNOSIDES-DOCUSATE SODIUM 8.6-50 MG PO TABS
2.0000 | ORAL_TABLET | ORAL | Status: DC
  Administered 2014-02-06 – 2014-02-07 (×2): 2 via ORAL
  Filled 2014-02-06 (×2): qty 2

## 2014-02-06 MED ORDER — OXYTOCIN 40 UNITS IN LACTATED RINGERS INFUSION - SIMPLE MED
INTRAVENOUS | Status: AC
  Administered 2014-02-06: 62.5 mL/h via INTRAVENOUS
  Filled 2014-02-06: qty 1000

## 2014-02-06 MED ORDER — BENZOCAINE-MENTHOL 20-0.5 % EX AERO
1.0000 "application " | INHALATION_SPRAY | CUTANEOUS | Status: DC | PRN
  Administered 2014-02-06: 1 via TOPICAL
  Filled 2014-02-06: qty 56

## 2014-02-06 MED ORDER — LACTATED RINGERS IV SOLN: INTRAVENOUS | Status: DC

## 2014-02-06 MED ORDER — LACTATED RINGERS IV SOLN: 500.0000 mL | INTRAVENOUS | Status: DC | PRN

## 2014-02-06 MED ORDER — MEASLES, MUMPS & RUBELLA VAC ~~LOC~~ INJ
0.5000 mL | INJECTION | Freq: Once | SUBCUTANEOUS | Status: DC
  Filled 2014-02-06: qty 0.5

## 2014-02-06 MED ORDER — FENTANYL 2.5 MCG/ML BUPIVACAINE 1/10 % EPIDURAL INFUSION (WH - ANES): 14.0000 mL/h | INTRAMUSCULAR | Status: DC | PRN

## 2014-02-06 MED ORDER — ONDANSETRON HCL 4 MG PO TABS: 4.0000 mg | ORAL_TABLET | ORAL | Status: DC | PRN

## 2014-02-06 MED ORDER — OXYTOCIN 40 UNITS IN LACTATED RINGERS INFUSION - SIMPLE MED
62.5000 mL/h | INTRAVENOUS | Status: DC
  Administered 2014-02-06: 62.5 mL/h via INTRAVENOUS

## 2014-02-06 MED ORDER — CITRIC ACID-SODIUM CITRATE 334-500 MG/5ML PO SOLN: 30.0000 mL | ORAL | Status: DC | PRN

## 2014-02-06 MED ORDER — ONDANSETRON HCL 4 MG/2ML IJ SOLN: 4.0000 mg | INTRAMUSCULAR | Status: DC | PRN

## 2014-02-06 MED ORDER — BUTORPHANOL TARTRATE 1 MG/ML IJ SOLN: 1.0000 mg | INTRAMUSCULAR | Status: DC | PRN

## 2014-02-06 MED ORDER — ONDANSETRON HCL 4 MG/2ML IJ SOLN: 4.0000 mg | Freq: Four times a day (QID) | INTRAMUSCULAR | Status: DC | PRN

## 2014-02-06 NOTE — Lactation Note (Signed)
This note was copied from the chart of Alexa Galvin ProfferPatricia Gallagher. Lactation Consultation Note Initial visit at 6 hours of age.  Mom is attempting latch with STS after bath.  Encouraged cross cradle hold, minimal assistance needed to latch baby.  Good deep latch with intermittent suckling bursts with good jaw movements, no audible swallows at this time.  Mom denies pain.   [facility] given and discussed.  Mom reports already knowing how to hand express and has been leaking from her breasts this pregnancy.  Discussed feeding cues and frequency.  Mom to call for assist as needed.   Patient Name: Alexa Gallagher Today's Date: 02/06/2014 Reason for consult: Initial assessment   Maternal Data Has patient been taught Hand Expression?: Yes Does the patient have breastfeeding experience prior to this delivery?: Yes  Feeding Feeding Type: Breast Fed Length of feed: 5 min  LATCH Score/Interventions Latch: Grasps breast easily, tongue down, lips flanged, rhythmical sucking.  Audible Swallowing: None  Type of Nipple: Everted at rest and after stimulation  Comfort (Breast/Nipple): Soft / non-tender     Hold (Positioning): Assistance needed to correctly position infant at breast and maintain latch. Intervention(s): Breastfeeding basics reviewed;Support Pillows;Position options;Skin to skin  LATCH Score: 7  Lactation Tools Discussed/Used     Consult Status Consult Status: Follow-up Date: 02/07/14 Follow-up type: In-patient    Sukhmani Fetherolf, Arvella MerlesJana Lynn 02/06/2014, 5:10 PM

## 2014-02-06 NOTE — MAU Note (Signed)
W/C from car to room 9. States UC's started at 0530. No leaking or bleeding.

## 2014-02-06 NOTE — H&P (Signed)
Pt is a 33 y/o black female who presents to the hospital c/o contractions. While in the ER she changed her cx. Her PNC was uncomplicated. She was 4-5 cm on admission.  PMHX: see hollister. PE: VSSAF- several B/Ps are elevated likely from pain.         HEENT- wnl         abd- gravid, non tender, has reported umbilical hernia.         FHTs- reactive. IMP/ IUP at term with labor. PLAN/ Admit            Expect SVD.

## 2014-02-07 LAB — CBC
HCT: 26.4 % — ABNORMAL LOW (ref 36.0–46.0)
Hemoglobin: 9.1 g/dL — ABNORMAL LOW (ref 12.0–15.0)
MCH: 30.7 pg (ref 26.0–34.0)
MCHC: 34.5 g/dL (ref 30.0–36.0)
MCV: 89.2 fL (ref 78.0–100.0)
PLATELETS: 232 10*3/uL (ref 150–400)
RBC: 2.96 MIL/uL — ABNORMAL LOW (ref 3.87–5.11)
RDW: 12.7 % (ref 11.5–15.5)
WBC: 9.2 10*3/uL (ref 4.0–10.5)

## 2014-02-07 NOTE — Progress Notes (Signed)
Post Partum Day 1 Subjective: no complaints, up ad lib, voiding and tolerating PO  Objective: Blood pressure 111/75, pulse 76, temperature 97.8 F (36.6 C), temperature source Oral, resp. rate 18, height 5\' 8"  (1.727 m), weight 97.977 kg (216 lb), unknown if currently breastfeeding.  Physical Exam:  General: alert, cooperative and appears stated age Lochia: appropriate Uterine Fundus: firm    Recent Labs  02/06/14 0928 02/07/14 0615  HGB 11.3* 9.1*  HCT 32.1* 26.4*    Assessment/Plan: Plan for discharge tomorrow and Breastfeeding   LOS: 1 day   Alexa Gallagher H. 02/07/2014, 1:41 PM

## 2014-02-08 MED ORDER — IBUPROFEN 600 MG PO TABS: 600.0000 mg | ORAL_TABLET | Freq: Four times a day (QID) | ORAL | Status: DC

## 2014-02-08 NOTE — Progress Notes (Signed)
Post Partum Day 2 Subjective: no complaints, up ad lib, voiding, tolerating PO and + flatus, patient is breast feeding has some breast soreness Mother and baby are bonding well  Objective: Blood pressure 116/82, pulse 80, temperature 98.4 F (36.9 C), temperature source Oral, resp. rate 20, height 5\' 8"  (1.727 m), weight 97.977 kg (216 lb), unknown if currently breastfeeding.  Physical Exam:  General: alert, cooperative and no distress Lochia: appropriate Uterine Fundus: firm Incision: No vaginal or perineal lacerations DVT Evaluation: No evidence of DVT seen on physical exam. No cords or calf tenderness. No significant calf/ankle edema.   Recent Labs  02/06/14 0928 02/07/14 0615  HGB 11.3* 9.1*  HCT 32.1* 26.4*    Assessment/Plan: Discharge home with follow up in office 6 weeks   LOS: 2 days   Florinda Taflinger STACIA 02/08/2014, 9:18 AM

## 2014-02-08 NOTE — Discharge Summary (Signed)
Obstetric Discharge Summary Reason for Admission: onset of labor Prenatal Procedures: none Intrapartum Procedures: spontaneous vaginal delivery Postpartum Procedures: none Complications-Operative and Postpartum: none Hemoglobin  Date Value Ref Range Status  02/07/2014 9.1* 12.0 - 15.0 g/dL Final     DELTA CHECK NOTED     REPEATED TO VERIFY  03/22/2012 12.7   Final     HCT  Date Value Ref Range Status  02/07/2014 26.4* 36.0 - 46.0 % Final  03/22/2012 38   Final    Physical Exam:  General: alert, cooperative and no distress Lochia: appropriate Uterine Fundus: firm Incision: No vaginal or perineal lacerations DVT Evaluation: No evidence of DVT seen on physical exam. No cords or calf tenderness. No significant calf/ankle edema.  Discharge Diagnoses: Term Pregnancy-delivered  Discharge Information: Date: 02/08/2014 Activity: unrestricted Diet: routine Medications: None, PNV and Ibuprofen Condition: stable Instructions: refer to practice specific booklet Discharge to: home   Follow-up Information   Follow up with Levi AlandANDERSON,MARK E, MD. Call in 6 weeks. (for post partum visit)    Specialty:  Obstetrics and Gynecology   Contact information:   [address] [place] [phone]       Newborn Data: Live born female  Birth Weight: 7 lb 10.4 oz (3470 g) APGAR: 9, 9  Home with mother.  Essie HartINN, Harly Pipkins STACIA 02/08/2014, 9:23 AM

## 2014-02-08 NOTE — Discharge Instructions (Signed)

## 2014-02-08 NOTE — Lactation Note (Addendum)
This note was copied from the chart of Alexa Galvin ProfferPatricia Gallagher. Lactation Consultation Note  Patient Name: Alexa Gallagher RUEAV'WToday's Date: 02/08/2014 Reason for consult: Follow-up assessment Baby has been cluster feeding and Mom reports some mild nipple tenderness. Mom latched baby independently at this visit. Some dimpling noted with baby suckling, demonstrated how to bring bottom lip down and dimpling resolved. Care for sore nipples reviewed. Comfort gels given with instructions. Mom requested hand pump. Engorgement care reviewed if needed. Advised of OP services and support group.   Maternal Data    Feeding Feeding Type: Breast Fed  LATCH Score/Interventions Latch: Grasps breast easily, tongue down, lips flanged, rhythmical sucking.  Audible Swallowing: Spontaneous and intermittent  Type of Nipple: Everted at rest and after stimulation  Comfort (Breast/Nipple): Filling, red/small blisters or bruises, mild/mod discomfort  Problem noted: Mild/Moderate discomfort Interventions (Mild/moderate discomfort): Comfort gels;Hand massage;Hand expression (EBM to sore nipples)        Lactation Tools Discussed/Used Tools: Comfort gels   Consult Status Consult Status: Complete Date: 02/08/14 Follow-up type: In-patient    Alfred LevinsGranger, Belynda Pagaduan Ann 02/08/2014, 9:52 AM

## 2014-02-08 NOTE — Progress Notes (Signed)
Post discharge chart review completed.  

## 2014-10-02 ENCOUNTER — Encounter (HOSPITAL_COMMUNITY): Payer: Self-pay | Admitting: *Deleted

## 2015-07-01 ENCOUNTER — Emergency Department (HOSPITAL_BASED_OUTPATIENT_CLINIC_OR_DEPARTMENT_OTHER)
Admission: EM | Admit: 2015-07-01 | Discharge: 2015-07-01 | Disposition: A | Discharge status: Home / Self Care | Payer: Medicaid Other | Attending: Emergency Medicine | Admitting: Emergency Medicine

## 2015-07-01 ENCOUNTER — Encounter (HOSPITAL_BASED_OUTPATIENT_CLINIC_OR_DEPARTMENT_OTHER): Payer: Self-pay | Admitting: *Deleted

## 2015-07-01 DIAGNOSIS — Z8742 Personal history of other diseases of the female genital tract: Secondary | ICD-10-CM | POA: Diagnosis not present

## 2015-07-01 DIAGNOSIS — Z9049 Acquired absence of other specified parts of digestive tract: Secondary | ICD-10-CM | POA: Insufficient documentation

## 2015-07-01 DIAGNOSIS — K529 Noninfective gastroenteritis and colitis, unspecified: Secondary | ICD-10-CM | POA: Insufficient documentation

## 2015-07-01 DIAGNOSIS — R112 Nausea with vomiting, unspecified: Secondary | ICD-10-CM | POA: Diagnosis present

## 2015-07-01 DIAGNOSIS — F419 Anxiety disorder, unspecified: Secondary | ICD-10-CM | POA: Diagnosis not present

## 2015-07-01 DIAGNOSIS — Z8701 Personal history of pneumonia (recurrent): Secondary | ICD-10-CM | POA: Diagnosis not present

## 2015-07-01 DIAGNOSIS — Z79899 Other long term (current) drug therapy: Secondary | ICD-10-CM | POA: Diagnosis not present

## 2015-07-01 DIAGNOSIS — Z3202 Encounter for pregnancy test, result negative: Secondary | ICD-10-CM | POA: Diagnosis not present

## 2015-07-01 LAB — URINALYSIS, ROUTINE W REFLEX MICROSCOPIC
Bilirubin Urine: NEGATIVE
Glucose, UA: NEGATIVE mg/dL
Ketones, ur: NEGATIVE mg/dL
Nitrite: NEGATIVE
Protein, ur: NEGATIVE mg/dL
SPECIFIC GRAVITY, URINE: 1.025 (ref 1.005–1.030)
UROBILINOGEN UA: 0.2 mg/dL (ref 0.0–1.0)
pH: 6 (ref 5.0–8.0)

## 2015-07-01 LAB — PREGNANCY, URINE: Preg Test, Ur: NEGATIVE

## 2015-07-01 LAB — URINE MICROSCOPIC-ADD ON

## 2015-07-01 MED ORDER — GLYCOPYRROLATE 0.2 MG/ML IJ SOLN
0.3000 mg | Freq: Once | INTRAMUSCULAR | Status: DC
  Filled 2015-07-01: qty 1.5

## 2015-07-01 MED ORDER — DIPHENOXYLATE-ATROPINE 2.5-0.025 MG PO TABS: 1.0000 | ORAL_TABLET | Freq: Four times a day (QID) | ORAL | Status: DC | PRN

## 2015-07-01 MED ORDER — KETOROLAC TROMETHAMINE 30 MG/ML IJ SOLN
30.0000 mg | Freq: Once | INTRAMUSCULAR | Status: AC
  Administered 2015-07-01: 30 mg via INTRAVENOUS
  Filled 2015-07-01: qty 1

## 2015-07-01 MED ORDER — DICYCLOMINE HCL 20 MG PO TABS: 20.0000 mg | ORAL_TABLET | Freq: Two times a day (BID) | ORAL | Status: DC

## 2015-07-01 MED ORDER — TRAMADOL HCL 50 MG PO TABS
50.0000 mg | ORAL_TABLET | Freq: Four times a day (QID) | ORAL | Status: DC | PRN
Start: 2015-07-01 — End: 2017-11-20

## 2015-07-01 MED ORDER — ONDANSETRON 4 MG PO TBDP: 4.0000 mg | ORAL_TABLET | Freq: Three times a day (TID) | ORAL | Status: DC | PRN

## 2015-07-01 MED ORDER — ONDANSETRON HCL 4 MG/2ML IJ SOLN
4.0000 mg | Freq: Once | INTRAMUSCULAR | Status: AC
  Administered 2015-07-01: 4 mg via INTRAVENOUS
  Filled 2015-07-01: qty 2

## 2015-07-01 MED ORDER — GLYCOPYRROLATE 0.2 MG/ML IJ SOLN
0.1000 mg | Freq: Once | INTRAMUSCULAR | Status: DC
  Filled 2015-07-01: qty 0.5

## 2015-07-01 MED ORDER — DICYCLOMINE HCL 10 MG/ML IM SOLN
20.0000 mg | Freq: Once | INTRAMUSCULAR | Status: AC
  Administered 2015-07-01: 20 mg via INTRAMUSCULAR
  Filled 2015-07-01: qty 2

## 2015-07-01 MED ORDER — DIPHENOXYLATE-ATROPINE 2.5-0.025 MG PO TABS
2.0000 | ORAL_TABLET | Freq: Once | ORAL | Status: AC
  Administered 2015-07-01: 2 via ORAL
  Filled 2015-07-01: qty 2

## 2015-07-01 MED ORDER — SODIUM CHLORIDE 0.9 % IV BOLUS (SEPSIS)
1000.0000 mL | Freq: Once | INTRAVENOUS | Status: AC
  Administered 2015-07-01: 1000 mL via INTRAVENOUS

## 2015-07-01 NOTE — ED Notes (Signed)
Patient c/o n/v/d since 3am, ate out last night, abd cramping

## 2015-07-01 NOTE — Discharge Instructions (Signed)

## 2015-07-01 NOTE — ED Provider Notes (Signed)
CSN: [id number]     Arrival date & time 07/01/15  0810 History   First MD Initiated Contact with Patient 07/01/15 [phone]     Chief Complaint  Patient presents with  . Diarrhea  . Emesis      HPI  Patient presents for evaluation of nausea, vomiting, and diarrhea. Symptoms starting proximal at 3 AM. She ate at [employer] last night. She ate alone. She is concerned because she states there is "that E. coli infection on TV". Describes abdominal cramping but not significant pain. No fever. No blood in her stool.  Past Medical History  Diagnosis Date  . Overactive bladder   . Abnormal Pap smear   . Pneumonia   . Anxiety   . Incisional hernia    Past Surgical History  Procedure Laterality Date  . Cholecystectomy    . Colposcopy vulva w/ biopsy      abnormal pap.   Family History  Problem Relation Age of Onset  . Anesthesia problems Neg Hx   . Cancer Mother     cervical cancer  . Anxiety disorder Mother   . Hypertension Father   . Diabetes Father     boreline  . Alcohol abuse Maternal Grandmother   . Depression Maternal Grandmother   . Anxiety disorder Maternal Grandmother   . Heart disease Paternal Grandmother   . Sickle cell anemia Paternal Grandfather    History  Substance Use Topics  . Smoking status: Never Smoker   . Smokeless tobacco: Not on file  . Alcohol Use: No   OB History    Gravida Para Term Preterm AB TAB SAB Ectopic Multiple Living   0 0 0 0 0 0 3     Review of Systems  Constitutional: Negative for fever, chills, diaphoresis, appetite change and fatigue.  HENT: Negative for mouth sores, sore throat and trouble swallowing.   Eyes: Negative for visual disturbance.  Respiratory: Negative for cough, chest tightness, shortness of breath and wheezing.   Cardiovascular: Negative for chest pain.  Gastrointestinal: Positive for nausea, vomiting, abdominal pain and diarrhea. Negative for abdominal distention.  Endocrine: Negative for polydipsia, polyphagia  and polyuria.  Genitourinary: Negative for dysuria, frequency and hematuria.  Musculoskeletal: Negative for gait problem.  Skin: Negative for color change, pallor and rash.  Neurological: Negative for dizziness, syncope, light-headedness and headaches.  Hematological: Does not bruise/bleed easily.  Psychiatric/Behavioral: Negative for behavioral problems and confusion.      Allergies  Review of patient's allergies indicates no known allergies.  Home Medications   Prior to Admission medications   Medication Sig Start Date End Date Taking? Authorizing Provider  dicyclomine (BENTYL) 20 MG tablet Take 1 tablet (20 mg total) by mouth 2 (two) times daily. 07/01/15   Rolland Porter, MD  diphenoxylate-atropine (LOMOTIL) 2.5-0.025 MG per tablet Take 1 tablet by mouth 4 (four) times daily as needed for diarrhea or loose stools. 07/01/15   Rolland Porter, MD  ibuprofen (ADVIL,MOTRIN) 600 MG tablet Take 1 tablet (600 mg total) by mouth every 6 (six) hours. 02/08/14   Essie Hart, MD  ondansetron (ZOFRAN ODT) 4 MG disintegrating tablet Take 1 tablet (4 mg total) by mouth every 8 (eight) hours as needed for nausea. 07/01/15   Rolland Porter, MD  Prenatal Vit-Fe Fumarate-FA (PRENATAL MULTIVITAMIN) TABS tablet Take 1 tablet by mouth daily at 12 noon.    Historical Provider, MD  traMADol (ULTRAM) 50 MG tablet Take 1 tablet (50 mg total) by mouth every 6 (six)  hours as needed. 07/01/15   Rolland Porter, MD   BP 122/88 mmHg  Pulse 106  Temp(Src) 98.3 F (36.8 C) (Oral)  Resp 18  Ht 5\' 8"  (1.727 m)  Wt 190 lb (86.183 kg)  BMI 28.90 kg/m2  SpO2 100% Physical Exam  Constitutional: She is oriented to person, place, and time. She appears well-developed and well-nourished. No distress.  HENT:  Head: Normocephalic.  Eyes: Conjunctivae are normal. Pupils are equal, round, and reactive to light. No scleral icterus.  Neck: Normal range of motion. Neck supple. No thyromegaly present.  Cardiovascular: Normal rate and regular  rhythm.  Exam reveals no gallop and no friction rub.   No murmur heard. Pulmonary/Chest: Effort normal and breath sounds normal. No respiratory distress. She has no wheezes. She has no rales.  Abdominal: Soft. Bowel sounds are normal. She exhibits no distension. There is no tenderness. There is no rebound.  Musculoskeletal: Normal range of motion.  Neurological: She is alert and oriented to person, place, and time.  Skin: Skin is warm and dry. No rash noted.  Psychiatric: She has a normal mood and affect. Her behavior is normal.    ED Course  Procedures (including critical care time) Labs Review Labs Reviewed  URINALYSIS, ROUTINE W REFLEX MICROSCOPIC (NOT AT [facility]) - Abnormal; Notable for the following:    APPearance CLOUDY (*)    Hgb urine dipstick LARGE (*)    Leukocytes, UA SMALL (*)    All other components within normal limits  URINE MICROSCOPIC-ADD ON - Abnormal; Notable for the following:    Bacteria, UA MANY (*)    Casts HYALINE CASTS (*)    All other components within normal limits  PREGNANCY, URINE    Imaging Review No results found.   EKG Interpretation None      MDM   Final diagnoses:  Gastroenteritis    Feeling better after symptomatic treatment and IV fluids. Plan is home, clear liquids, Zofran, Bentyl, Lomotil. Recheck high fever or worsening pain and bloody stools other changes.    Rolland Porter, MD 07/01/15 1006

## 2015-07-02 ENCOUNTER — Emergency Department (HOSPITAL_BASED_OUTPATIENT_CLINIC_OR_DEPARTMENT_OTHER)
Admission: EM | Admit: 2015-07-02 | Discharge: 2015-07-02 | Disposition: A | Discharge status: Home / Self Care | Payer: Medicaid Other | Attending: Emergency Medicine | Admitting: Emergency Medicine

## 2015-07-02 ENCOUNTER — Encounter (HOSPITAL_BASED_OUTPATIENT_CLINIC_OR_DEPARTMENT_OTHER): Payer: Self-pay | Admitting: Emergency Medicine

## 2015-07-02 ENCOUNTER — Emergency Department (HOSPITAL_BASED_OUTPATIENT_CLINIC_OR_DEPARTMENT_OTHER): Payer: Medicaid Other

## 2015-07-02 DIAGNOSIS — F419 Anxiety disorder, unspecified: Secondary | ICD-10-CM | POA: Diagnosis not present

## 2015-07-02 DIAGNOSIS — R1013 Epigastric pain: Secondary | ICD-10-CM | POA: Insufficient documentation

## 2015-07-02 DIAGNOSIS — R079 Chest pain, unspecified: Secondary | ICD-10-CM | POA: Diagnosis present

## 2015-07-02 DIAGNOSIS — Z8701 Personal history of pneumonia (recurrent): Secondary | ICD-10-CM | POA: Insufficient documentation

## 2015-07-02 DIAGNOSIS — R197 Diarrhea, unspecified: Secondary | ICD-10-CM | POA: Diagnosis not present

## 2015-07-02 DIAGNOSIS — R112 Nausea with vomiting, unspecified: Secondary | ICD-10-CM | POA: Insufficient documentation

## 2015-07-02 DIAGNOSIS — Z79899 Other long term (current) drug therapy: Secondary | ICD-10-CM | POA: Diagnosis not present

## 2015-07-02 DIAGNOSIS — R0789 Other chest pain: Secondary | ICD-10-CM | POA: Insufficient documentation

## 2015-07-02 LAB — I-STAT CHEM 8, ED
BUN: 3 mg/dL — ABNORMAL LOW (ref 6–20)
CALCIUM ION: 1.12 mmol/L (ref 1.12–1.23)
Chloride: 103 mmol/L (ref 101–111)
Creatinine, Ser: 0.7 mg/dL (ref 0.44–1.00)
GLUCOSE: 105 mg/dL — AB (ref 65–99)
HCT: 36 % (ref 36.0–46.0)
Hemoglobin: 12.2 g/dL (ref 12.0–15.0)
Potassium: 3.4 mmol/L — ABNORMAL LOW (ref 3.5–5.1)
Sodium: 142 mmol/L (ref 135–145)
TCO2: 22 mmol/L (ref 0–100)

## 2015-07-02 LAB — TROPONIN I: Troponin I: <0.03 ng/mL (ref <0.031)

## 2015-07-02 LAB — LIPASE, BLOOD: Lipase: 15 U/L — ABNORMAL LOW (ref 22–51)

## 2015-07-02 LAB — HEPATIC FUNCTION PANEL
ALBUMIN: 3.2 g/dL — AB (ref 3.5–5.0)
ALT: 16 U/L (ref 14–54)
AST: 25 U/L (ref 15–41)
Alkaline Phosphatase: 58 U/L (ref 38–126)
Total Bilirubin: 0.6 mg/dL (ref 0.3–1.2)
Total Protein: 6.8 g/dL (ref 6.5–8.1)

## 2015-07-02 MED ORDER — PANTOPRAZOLE SODIUM 40 MG PO TBEC: 40.0000 mg | DELAYED_RELEASE_TABLET | Freq: Every day | ORAL | Status: DC

## 2015-07-02 MED ORDER — GI COCKTAIL ~~LOC~~
ORAL | Status: AC
  Filled 2015-07-02: qty 30

## 2015-07-02 MED ORDER — PANTOPRAZOLE SODIUM 40 MG PO TBEC
40.0000 mg | DELAYED_RELEASE_TABLET | Freq: Every day | ORAL | Status: DC
  Administered 2015-07-02: 40 mg via ORAL
  Filled 2015-07-02: qty 1

## 2015-07-02 MED ORDER — GI COCKTAIL ~~LOC~~
30.0000 mL | Freq: Once | ORAL | Status: AC
  Administered 2015-07-02: 30 mL via ORAL

## 2015-07-02 MED ORDER — OXYCODONE-ACETAMINOPHEN 5-325 MG PO TABS
1.0000 | ORAL_TABLET | Freq: Once | ORAL | Status: AC
  Administered 2015-07-02: 1 via ORAL
  Filled 2015-07-02: qty 1

## 2015-07-02 MED ORDER — OXYCODONE-ACETAMINOPHEN 5-325 MG PO TABS: 1.0000 | ORAL_TABLET | Freq: Four times a day (QID) | ORAL | Status: DC | PRN

## 2015-07-02 NOTE — Discharge Instructions (Signed)
You were seen today for chest and epigastric pain. Your workup is reassuring and your cardiac testing is unremarkable. Given your recent history of nausea and vomiting and tenderness upon palpation of the epigastrium, your chest pain may be related to heartburn or inflammation of your esophagus and stomach. You will be discharged with a acid reducer and pain medication. Due to difficulties in the lab, some of your lab work is still pending. He will be called if any if this is abnormal.   Chest Pain (Nonspecific) It is often hard to give a specific diagnosis for the cause of chest pain. There is always a chance that your pain could be related to something serious, such as a heart attack or a blood clot in the lungs. You need to follow up with your health care provider for further evaluation. CAUSES   Heartburn.  Pneumonia or bronchitis.  Anxiety or stress.  Inflammation around your heart (pericarditis) or lung (pleuritis or pleurisy).  A blood clot in the lung.  A collapsed lung (pneumothorax). It can develop suddenly on its own (spontaneous pneumothorax) or from trauma to the chest.  Shingles infection (herpes zoster virus). The chest wall is composed of bones, muscles, and cartilage. Any of these can be the source of the pain.  The bones can be bruised by injury.  The muscles or cartilage can be strained by coughing or overwork.  The cartilage can be affected by inflammation and become sore (costochondritis). DIAGNOSIS  Lab tests or other studies may be needed to find the cause of your pain. Your health care provider may have you take a test called an ambulatory electrocardiogram (ECG). An ECG records your heartbeat patterns over a 24-hour period. You may also have other tests, such as:  Transthoracic echocardiogram (TTE). During echocardiography, sound waves are used to evaluate how blood flows through your heart.  Transesophageal echocardiogram (TEE).  Cardiac monitoring. This  allows your health care provider to monitor your heart rate and rhythm in real time.  Holter monitor. This is a portable device that records your heartbeat and can help diagnose heart arrhythmias. It allows your health care provider to track your heart activity for several days, if needed.  Stress tests by exercise or by giving medicine that makes the heart beat faster. TREATMENT   Treatment depends on what may be causing your chest pain. Treatment may include:  Acid blockers for heartburn.  Anti-inflammatory medicine.  Pain medicine for inflammatory conditions.  Antibiotics if an infection is present.  You may be advised to change lifestyle habits. This includes stopping smoking and avoiding alcohol, caffeine, and chocolate.  You may be advised to keep your head raised (elevated) when sleeping. This reduces the chance of acid going backward from your stomach into your esophagus. Most of the time, nonspecific chest pain will improve within 2-3 days with rest and mild pain medicine.  HOME CARE INSTRUCTIONS   If antibiotics were prescribed, take them as directed. Finish them even if you start to feel better.  For the next few days, avoid physical activities that bring on chest pain. Continue physical activities as directed.  Do not use any tobacco products, including cigarettes, chewing tobacco, or electronic cigarettes.  Avoid drinking alcohol.  Only take medicine as directed by your health care provider.  Follow your health care provider's suggestions for further testing if your chest pain does not go away.  Keep any follow-up appointments you made. If you do not go to an appointment, you could develop  lasting (chronic) problems with pain. If there is any problem keeping an appointment, call to reschedule. SEEK MEDICAL CARE IF:   Your chest pain does not go away, even after treatment.  You have a rash with blisters on your chest.  You have a fever. SEEK IMMEDIATE MEDICAL  CARE IF:   You have increased chest pain or pain that spreads to your arm, neck, jaw, back, or abdomen.  You have shortness of breath.  You have an increasing cough, or you cough up blood.  You have severe back or abdominal pain.  You feel nauseous or vomit.  You have severe weakness.  You faint.  You have chills. This is an emergency. Do not wait to see if the pain will go away. Get medical help at once. Call your local emergency services (911 in [place] Do not drive yourself to the hospital. MAKE SURE YOU:   Understand these instructions.  Will watch your condition.  Will get help right away if you are not doing well or get worse. Document Released: 08/27/2005 Document Revised: 11/22/2013 Document Reviewed: 06/22/2008 [facility] Patient Information 2015 [place], [place]. This information is not intended to replace advice given to you by your health care provider. Make sure you discuss any questions you have with your health care provider.

## 2015-07-02 NOTE — ED Notes (Signed)
Patient seen in ED today for n/v/d after eating in restaurant. Reports taking Tramadol 50 mg at 2230 and began having chest pressure at approximately 0100. States this reminds her of a past experience where she was having a panic attack. Denies shortness of breath or other symptoms.

## 2015-07-02 NOTE — ED Provider Notes (Addendum)
CSN: [id number]     Arrival date & time 07/02/15  0151 History   First MD Initiated Contact with Patient 07/02/15 0216     Chief Complaint  Patient presents with  . Chest Pain     (Consider location/radiation/quality/duration/timing/severity/associated sxs/prior Treatment) HPI  This is a 34 year old female who presents with chest pain. Patient was seen yesterday morning for nausea, vomiting, and diarrhea. She was discharged home with symptomatic support. She states that she took a tramadol at 10:30 PM. She woke up with chest pressure at 1 AM. She has a history of panic attacks but denies anxiety at this time. She states the pain comes and goes. It is pressure-like. It is currently 2 out of 10. She reports multiple episodes of vomiting after discharge earlier yesterday area.  Denies any cough, fever, or shortness of breath.  Past Medical History  Diagnosis Date  . Overactive bladder   . Abnormal Pap smear   . Pneumonia   . Anxiety   . Incisional hernia    Past Surgical History  Procedure Laterality Date  . Cholecystectomy    . Colposcopy vulva w/ biopsy      abnormal pap.   Family History  Problem Relation Age of Onset  . Anesthesia problems Neg Hx   . Cancer Mother     cervical cancer  . Anxiety disorder Mother   . Hypertension Father   . Diabetes Father     boreline  . Alcohol abuse Maternal Grandmother   . Depression Maternal Grandmother   . Anxiety disorder Maternal Grandmother   . Heart disease Paternal Grandmother   . Sickle cell anemia Paternal Grandfather    History  Substance Use Topics  . Smoking status: Never Smoker   . Smokeless tobacco: Not on file  . Alcohol Use: No   OB History    Gravida Para Term Preterm AB TAB SAB Ectopic Multiple Living   3 3 3  0 0 0 0 0 0 3     Review of Systems  Constitutional: Negative for fever.  Respiratory: Positive for chest tightness. Negative for cough and shortness of breath.   Cardiovascular: Negative for chest pain  and leg swelling.  Gastrointestinal: Positive for nausea, vomiting and diarrhea. Negative for abdominal pain.  Genitourinary: Negative for dysuria.  Musculoskeletal: Negative for back pain.  Neurological: Negative for headaches.  Psychiatric/Behavioral: Negative for confusion.  All other systems reviewed and are negative.     Allergies  Review of patient's allergies indicates no known allergies.  Home Medications   Prior to Admission medications   Medication Sig Start Date End Date Taking? Authorizing Provider  dicyclomine (BENTYL) 20 MG tablet Take 1 tablet (20 mg total) by mouth 2 (two) times daily. 07/01/15   Rolland Porter, MD  diphenoxylate-atropine (LOMOTIL) 2.5-0.025 MG per tablet Take 1 tablet by mouth 4 (four) times daily as needed for diarrhea or loose stools. 07/01/15   Rolland Porter, MD  ibuprofen (ADVIL,MOTRIN) 600 MG tablet Take 1 tablet (600 mg total) by mouth every 6 (six) hours. 02/08/14   Essie Hart, MD  ondansetron (ZOFRAN ODT) 4 MG disintegrating tablet Take 1 tablet (4 mg total) by mouth every 8 (eight) hours as needed for nausea. 07/01/15   Rolland Porter, MD  pantoprazole (PROTONIX) 40 MG tablet Take 1 tablet (40 mg total) by mouth daily. 07/02/15   Shon Baton, MD  Prenatal Vit-Fe Fumarate-FA (PRENATAL MULTIVITAMIN) TABS tablet Take 1 tablet by mouth daily at 12 noon.    Historical  Provider, MD  traMADol (ULTRAM) 50 MG tablet Take 1 tablet (50 mg total) by mouth every 6 (six) hours as needed. 07/01/15   Rolland Porter, MD   BP 97/57 mmHg  Pulse 56  Temp(Src) 99.2 F (37.3 C) (Oral)  Resp 10  SpO2 99%  LMP 06/26/2015 Physical Exam  Constitutional: She is oriented to person, place, and time. She appears well-developed and well-nourished. No distress.  HENT:  Head: Normocephalic and atraumatic.  Neck: Neck supple.  Cardiovascular: Normal rate, regular rhythm and normal heart sounds.   No murmur heard. Pulmonary/Chest: Effort normal and breath sounds normal. No respiratory  distress. She has no wheezes. She exhibits no tenderness.  Abdominal: Soft. Bowel sounds are normal. There is tenderness. There is no rebound and no guarding.  Mild epigastric tenderness to palpation Easily reducible ventral hernia noted  Neurological: She is alert and oriented to person, place, and time.  Skin: Skin is warm and dry.  Psychiatric: She has a normal mood and affect.  Nursing note and vitals reviewed.   ED Course  Procedures (including critical care time) Labs Review Labs Reviewed  LIPASE, BLOOD - Abnormal; Notable for the following:    Lipase 15 (*)    All other components within normal limits  HEPATIC FUNCTION PANEL - Abnormal; Notable for the following:    Albumin 3.2 (*)    Bilirubin, Direct <0.1 (*)    All other components within normal limits  I-STAT CHEM 8, ED - Abnormal; Notable for the following:    Potassium 3.4 (*)    BUN 3 (*)    Glucose, Bld 105 (*)    All other components within normal limits  TROPONIN I    Imaging Review Dg Chest 2 View  07/02/2015   CLINICAL DATA:  Nausea and vomiting, postprandial today. Chest pressure onset at 01:00.  EXAM: CHEST  2 VIEW  COMPARISON:  04/01/2008  FINDINGS: The heart size and mediastinal contours are within normal limits. Both lungs are clear. The visualized skeletal structures are unremarkable.  IMPRESSION: No active cardiopulmonary disease.   Electronically Signed   By: Ellery Plunk M.D.   On: 07/02/2015 03:14     EKG Interpretation   Date/Time:  Monday July 02 2015 02:05:30 EDT Ventricular Rate:  76 PR Interval:  174 QRS Duration: 84 QT Interval:  392 QTC Calculation: 441 R Axis:   76 Text Interpretation:  Normal sinus rhythm Possible Left atrial enlargement  Borderline ECG NO prior for comparison Confirmed by Camika Marsico  MD, Rayvon Dakin  ([id number]) on 07/02/2015 2:05:07 AM      MDM   Final diagnoses:  Epigastric pain    Patient presents with chest/epigastric pain. Nontoxic on exam. Vital signs are  reassuring. Low risk for ACS and EKG is reassuring. She is actually tender in her epigastrium and with recent history of nausea and vomiting would be suspicious for gastritis. No risk factors for PE and is PERC negative.  Chest x-ray reassuring and basic labwork obtained. Patient was given a GI cocktail. She had brief improvement of her symptoms with GI cocktail. On recheck, patient now reports return of symptoms. She now states that this is similar to when she had her gallbladder taken out. LFTs and lipase were added to patient workup. Patient was given Percocet and Protonix. Additional lab work was ordered at 4 AM. At approximate 6 AM, it had just been taken to [facility] for processing and would likely be an additional 1-2 hours. On recheck, patient is improved  and nontender. Given normal lab work this morning, do not believe that this lab work will add to the patient's clinical course. Discussed with patient pain management home and addition of a PPI. Patient stated understanding. She was given return precautions. Will review lab work when resulted and call patient if clinically relevant.  After history, exam, and medical workup I feel the patient has been appropriately medically screened and is safe for discharge home. Pertinent diagnoses were discussed with the patient. Patient was given return precautions.     Shon Baton, MD 07/02/15 [phone]  Liver function and lipase test reassuring.  Shon Baton, MD 07/02/15 [phone]

## 2016-01-06 ENCOUNTER — Emergency Department (HOSPITAL_COMMUNITY)
Admission: EM | Admit: 2016-01-06 | Discharge: 2016-01-06 | Disposition: A | Discharge status: Home / Self Care | Payer: Medicaid Other | Attending: Emergency Medicine | Admitting: Emergency Medicine

## 2016-01-06 ENCOUNTER — Encounter (HOSPITAL_COMMUNITY): Payer: Self-pay

## 2016-01-06 DIAGNOSIS — F419 Anxiety disorder, unspecified: Secondary | ICD-10-CM | POA: Insufficient documentation

## 2016-01-06 DIAGNOSIS — Y9389 Activity, other specified: Secondary | ICD-10-CM | POA: Diagnosis not present

## 2016-01-06 DIAGNOSIS — S161XXA Strain of muscle, fascia and tendon at neck level, initial encounter: Secondary | ICD-10-CM

## 2016-01-06 DIAGNOSIS — Z87448 Personal history of other diseases of urinary system: Secondary | ICD-10-CM | POA: Diagnosis not present

## 2016-01-06 DIAGNOSIS — Y998 Other external cause status: Secondary | ICD-10-CM | POA: Diagnosis not present

## 2016-01-06 DIAGNOSIS — S199XXA Unspecified injury of neck, initial encounter: Secondary | ICD-10-CM | POA: Diagnosis present

## 2016-01-06 DIAGNOSIS — Y92410 Unspecified street and highway as the place of occurrence of the external cause: Secondary | ICD-10-CM | POA: Diagnosis not present

## 2016-01-06 DIAGNOSIS — Z8701 Personal history of pneumonia (recurrent): Secondary | ICD-10-CM | POA: Diagnosis not present

## 2016-01-06 DIAGNOSIS — Z8719 Personal history of other diseases of the digestive system: Secondary | ICD-10-CM | POA: Insufficient documentation

## 2016-01-06 DIAGNOSIS — Z79899 Other long term (current) drug therapy: Secondary | ICD-10-CM | POA: Diagnosis not present

## 2016-01-06 DIAGNOSIS — Z791 Long term (current) use of non-steroidal anti-inflammatories (NSAID): Secondary | ICD-10-CM | POA: Diagnosis not present

## 2016-01-06 DIAGNOSIS — S4992XA Unspecified injury of left shoulder and upper arm, initial encounter: Secondary | ICD-10-CM | POA: Diagnosis not present

## 2016-01-06 DIAGNOSIS — S8002XA Contusion of left knee, initial encounter: Secondary | ICD-10-CM | POA: Insufficient documentation

## 2016-01-06 MED ORDER — METHOCARBAMOL 500 MG PO TABS
1000.0000 mg | ORAL_TABLET | Freq: Once | ORAL | Status: AC
Start: 2016-01-06 — End: 2016-01-06
  Administered 2016-01-06: 1000 mg via ORAL
  Filled 2016-01-06: qty 2

## 2016-01-06 MED ORDER — IBUPROFEN 200 MG PO TABS
400.0000 mg | ORAL_TABLET | Freq: Once | ORAL | Status: AC
  Administered 2016-01-06: 400 mg via ORAL
  Filled 2016-01-06: qty 2

## 2016-01-06 MED ORDER — METHOCARBAMOL 500 MG PO TABS: 1000.0000 mg | ORAL_TABLET | Freq: Three times a day (TID) | ORAL | Status: DC | PRN
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
## 2016-05-06 ENCOUNTER — Other Ambulatory Visit: Payer: Self-pay | Admitting: Obstetrics and Gynecology

## 2016-05-06 DIAGNOSIS — N63 Unspecified lump in unspecified breast: Secondary | ICD-10-CM

## 2016-06-30 ENCOUNTER — Emergency Department (HOSPITAL_COMMUNITY)
Admission: EM | Admit: 2016-06-30 | Discharge: 2016-07-01 | Disposition: A | Discharge status: Home / Self Care | Payer: Medicaid Other | Attending: Emergency Medicine | Admitting: Emergency Medicine

## 2016-06-30 ENCOUNTER — Encounter (HOSPITAL_COMMUNITY): Payer: Self-pay | Admitting: Emergency Medicine

## 2016-06-30 DIAGNOSIS — F329 Major depressive disorder, single episode, unspecified: Secondary | ICD-10-CM | POA: Diagnosis not present

## 2016-06-30 DIAGNOSIS — F330 Major depressive disorder, recurrent, mild: Secondary | ICD-10-CM | POA: Diagnosis not present

## 2016-06-30 DIAGNOSIS — Z5181 Encounter for therapeutic drug level monitoring: Secondary | ICD-10-CM | POA: Diagnosis not present

## 2016-06-30 DIAGNOSIS — R45851 Suicidal ideations: Secondary | ICD-10-CM | POA: Diagnosis present

## 2016-06-30 LAB — I-STAT BETA HCG BLOOD, ED (MC, WL, AP ONLY): I-stat hCG, quantitative: <5 m[IU]/mL (ref <5)

## 2016-06-30 LAB — CBC
HCT: 36.7 % (ref 36.0–46.0)
Hemoglobin: 12.2 g/dL (ref 12.0–15.0)
MCH: 29.1 pg (ref 26.0–34.0)
MCHC: 33.2 g/dL (ref 30.0–36.0)
MCV: 87.6 fL (ref 78.0–100.0)
Platelets: 310 10*3/uL (ref 150–400)
RBC: 4.19 MIL/uL (ref 3.87–5.11)
RDW: 12.5 % (ref 11.5–15.5)
WBC: 6.2 10*3/uL (ref 4.0–10.5)

## 2016-06-30 LAB — COMPREHENSIVE METABOLIC PANEL
ALBUMIN: 4 g/dL (ref 3.5–5.0)
ALT: 13 U/L — ABNORMAL LOW (ref 14–54)
AST: 22 U/L (ref 15–41)
Alkaline Phosphatase: 55 U/L (ref 38–126)
Anion gap: 7 (ref 5–15)
BUN: 11 mg/dL (ref 6–20)
CO2: 26 mmol/L (ref 22–32)
CREATININE: 0.74 mg/dL (ref 0.44–1.00)
Calcium: 8.9 mg/dL (ref 8.9–10.3)
Chloride: 105 mmol/L (ref 101–111)
GFR calc non Af Amer: >60 mL/min (ref >60)
Glucose, Bld: 85 mg/dL (ref 65–99)
Potassium: 3.3 mmol/L — ABNORMAL LOW (ref 3.5–5.1)
SODIUM: 138 mmol/L (ref 135–145)
Total Bilirubin: 0.6 mg/dL (ref 0.3–1.2)
Total Protein: 8.3 g/dL — ABNORMAL HIGH (ref 6.5–8.1)

## 2016-06-30 LAB — RAPID URINE DRUG SCREEN, HOSP PERFORMED
AMPHETAMINES: NOT DETECTED
Barbiturates: NOT DETECTED
Benzodiazepines: NOT DETECTED
COCAINE: NOT DETECTED
OPIATES: NOT DETECTED
TETRAHYDROCANNABINOL: NOT DETECTED

## 2016-06-30 LAB — ACETAMINOPHEN LEVEL: Acetaminophen (Tylenol), Serum: <10 ug/mL — ABNORMAL LOW (ref 10–30)

## 2016-06-30 LAB — ETHANOL: Alcohol, Ethyl (B): <5 mg/dL (ref <5)

## 2016-06-30 LAB — SALICYLATE LEVEL

## 2016-06-30 NOTE — ED Notes (Signed)
Patient pleasant on approach. Reports increased depression and reports some passive SI thoughts prior to coming to hospital. Family encouraged her to come to hospital. Reports that she has had these type of symptoms in the past but didn't get treatment. Asking when someone will see her tonight. Unknown when TTS will assess.

## 2016-06-30 NOTE — ED Triage Notes (Signed)
Pt states she is having suicidal thoughts  Pt states she has been having them off and on  No plan  Pt states she is just loosing all hope

## 2016-06-30 NOTE — ED Notes (Signed)
Pt belongs was giving to sister by [employer]

## 2016-06-30 NOTE — ED Provider Notes (Signed)
Emergency Department Provider Note   I have reviewed the triage vital signs and the nursing notes.   HISTORY  Chief Complaint Suicidal   HPI Alexa Gallagher is a 35 y.o. female with PMH of depression and anxiety presents to the emergency department for evaluation of suicidal thoughts. The patient states that over the past several days she's had increasing suicidal thoughts and had begun to feel unsafe at home. She related this to her sister who contacted the suicide prevention Hotline. From there they requested that the patient go to the emergency department. Patient states that she is not developed a plan to harm herself but is actively thinking about it. She denies homicidal ideation. Denies any auditory or visual hallucinations. She denies any drug use. She has been drinking 3-4 glasses of "whatever" over the last several weeks. She denies any history of EtOH withdrawals or seizures.    Past Medical History:  Diagnosis Date  . Abnormal Pap smear   . Anxiety   . Incisional hernia   . Overactive bladder   . Pneumonia     Patient Active Problem List   Diagnosis Date Noted  . Active labor 02/06/2014  . Incisional hernia, without obstruction or gangrene 08/05/2013  . Supervision of other normal pregnancy 08/09/2012    Past Surgical History:  Procedure Laterality Date  . CHOLECYSTECTOMY    . COLPOSCOPY VULVA W/ BIOPSY     abnormal pap.  Marland Kitchen HERNIA REPAIR      Current Outpatient Rx  . Order #: [id number] Class: Print  . Order #: [id number] Class: Print  . Order #: [id number] Class: Normal  . Order #: [id number] Class: Print  . Order #: [id number] Class: Print  . Order #: [id number] Class: Print  . Order #: [id number] Class: Print    Allergies Review of patient's allergies indicates no known allergies.  Family History  Problem Relation Age of Onset  . Cancer Mother     cervical cancer  . Anxiety disorder Mother   . Hypertension Father   . Diabetes Father     boreline  .  Alcohol abuse Maternal Grandmother   . Depression Maternal Grandmother   . Anxiety disorder Maternal Grandmother   . Heart disease Paternal Grandmother   . Sickle cell anemia Paternal Grandfather   . Anesthesia problems Neg Hx     Social History Social History  Substance Use Topics  . Smoking status: Never Smoker  . Smokeless tobacco: Never Used  . Alcohol use Yes    Review of Systems  Constitutional: No fever/chills Eyes: No visual changes. ENT: No sore throat. Cardiovascular: Denies chest pain. Respiratory: Denies shortness of breath. Gastrointestinal: No abdominal pain.  No nausea, no vomiting.  No diarrhea.  No constipation. Genitourinary: Negative for dysuria. Musculoskeletal: Negative for back pain. Skin: Negative for rash. Neurological: Negative for headaches, focal weakness or numbness.  10-point ROS otherwise negative.  ____________________________________________   PHYSICAL EXAM:  VITAL SIGNS: ED Triage Vitals [06/30/16 1943]  Enc Vitals Group     BP (!) 137/104     Pulse Rate 96     Resp 18     Temp 98.9 F (37.2 C)     Temp Source Oral     SpO2 100 %     Weight 175 lb (79.4 kg)     Height 5\' 8"  (1.727 m)   Constitutional: Alert and oriented. Well appearing and in no acute distress. Eyes: Conjunctivae are normal. PERRL. EOMI. Head: Atraumatic. Nose: No congestion/rhinnorhea. Mouth/Throat: Mucous membranes are moist.  Oropharynx non-erythematous. Neck: No stridor.  Cardiovascular: Normal rate, regular rhythm. Good peripheral circulation. Grossly normal heart sounds.   Respiratory: Normal respiratory effort.  No retractions. Lungs CTAB. Gastrointestinal: Soft and nontender. No distention.  Musculoskeletal: No lower extremity tenderness nor edema. No gross deformities of extremities. Neurologic:  Normal speech and language. No gross focal neurologic deficits are appreciated.  Skin:  Skin is warm, dry and intact. No rash noted. Psychiatric: Mood and  affect are normal. Speech and behavior are normal.  ____________________________________________   LABS (all labs ordered are listed, but only abnormal results are displayed)  Labs Reviewed  COMPREHENSIVE METABOLIC PANEL - Abnormal; Notable for the following:       Result Value   Potassium 3.3 (*)    Total Protein 8.3 (*)    ALT 13 (*)    All other components within normal limits  ACETAMINOPHEN LEVEL - Abnormal; Notable for the following:    Acetaminophen (Tylenol), Serum <10 (*)    All other components within normal limits  ETHANOL  SALICYLATE LEVEL  CBC  URINE RAPID DRUG SCREEN, HOSP PERFORMED  I-STAT BETA HCG BLOOD, ED ([facility], [facility], [facility] ONLY)   ____________________________________________  RADIOLOGY  None ____________________________________________   PROCEDURES  Procedure(s) performed:   Procedures  None ____________________________________________   INITIAL IMPRESSION / ASSESSMENT AND PLAN / ED COURSE  Pertinent labs & imaging results that were available during my care of the patient were reviewed by me and considered in my medical decision making (see chart for details).  Patient resents to the emergency department for evaluation of worsening suicidal thinking. She has not made a plan but is actively considering suicide. No homicidal thoughts. The patient has been diagnosed with depression in the past but is not on medications currently. Patient is calm and cooperative with no signs of alcohol withdrawal at this time. I believe that this time the patient is medically cleared for TTS evaluation.    ____________________________________________  FINAL CLINICAL IMPRESSION(S) / ED DIAGNOSES  Final diagnoses:  Suicidal ideation     MEDICATIONS GIVEN DURING THIS VISIT:  None  NEW OUTPATIENT MEDICATIONS STARTED DURING THIS VISIT:  None   Note:  This document was prepared using Dragon voice recognition software and may include unintentional dictation  errors.  Alona Bene, MD Emergency Medicine   Maia Plan, MD 06/30/16 2215

## 2016-07-01 DIAGNOSIS — F330 Major depressive disorder, recurrent, mild: Secondary | ICD-10-CM | POA: Diagnosis not present

## 2016-07-01 MED ORDER — TRAZODONE HCL 50 MG PO TABS
50.0000 mg | ORAL_TABLET | Freq: Every evening | ORAL | Status: DC | PRN
  Administered 2016-07-01: 50 mg via ORAL
  Filled 2016-07-01: qty 1

## 2016-07-01 NOTE — Progress Notes (Addendum)
Medicaid [place] access response hx indicates the [facility] pcp is [facility] [address] [place], [place] [address] [phone] Entered in d/c instructions  [facility]      [facility] [address] [place], [place] [address] [phone]   Next Steps: Go today  Instructions: This is your assigned medicaid [place] access doctor If you decide that you want another medicaid provider in [facility], Please call the local DSS office at [phone]

## 2016-07-01 NOTE — [facility] Suicide Risk Assessment (Signed)
Suicide Risk Assessment  Discharge Assessment   [facility] Discharge Suicide Risk Assessment   Principal Problem: Major depressive disorder, recurrent episode, mild with anxious distress [facility]) Discharge Diagnoses:  Patient Active Problem List   Diagnosis Date Noted  . Major depressive disorder, recurrent episode, mild with anxious distress (HCC) [F33.0] 07/01/2016    Priority: High  . Active labor [O80, Z37.9] 02/06/2014  . Incisional hernia, without obstruction or gangrene [K43.2] 08/05/2013  . Supervision of other normal pregnancy [Z34.80] 08/09/2012    Total Time spent with patient: 45 minutes  Musculoskeletal: Strength & Muscle Tone: within normal limits Gait & Station: normal Patient leans: N/A  Psychiatric Specialty Exam: Physical Exam  Constitutional: She is oriented to person, place, and time. She appears well-developed and well-nourished.  HENT:  Head: Normocephalic.  Neck: Normal range of motion.  Respiratory: Effort normal.  Musculoskeletal: Normal range of motion.  Neurological: She is alert and oriented to person, place, and time.  Skin: Skin is warm and dry.  Psychiatric: Her speech is normal and behavior is normal. Judgment and thought content normal. Cognition and memory are normal. She exhibits a depressed mood.    Review of Systems  Constitutional: Negative.   HENT: Negative.   Eyes: Negative.   Respiratory: Negative.   Cardiovascular: Negative.   Gastrointestinal: Negative.   Genitourinary: Negative.   Musculoskeletal: Negative.   Skin: Negative.   Neurological: Negative.   Endo/Heme/Allergies: Negative.   Psychiatric/Behavioral: Positive for depression.    Blood pressure 111/80, pulse 77, temperature 98 F (36.7 C), temperature source Oral, resp. rate 18, height 5\' 8"  (1.727 m), weight 79.4 kg (175 lb), last menstrual period 06/25/2016, SpO2 100 %, unknown if currently breastfeeding.Body mass index is 26.61 kg/m.  General Appearance: Casual  Eye  Contact:  Good  Speech:  Normal Rate  Volume:  Normal  Mood:  Depressed  Affect:  Congruent  Thought Process:  Coherent and Descriptions of Associations: Intact  Orientation:  Full (Time, Place, and Person)  Thought Content:  WDL  Suicidal Thoughts:  No  Homicidal Thoughts:  No  Memory:  Immediate;   Good Recent;   Good Remote;   Good  Judgement:  Fair  Insight:  Fair  Psychomotor Activity:  Normal  Concentration:  Concentration: Good and Attention Span: Good  Recall:  Good  Fund of Knowledge:  Good  Language:  Good  Akathisia:  No  Handed:  Right  AIMS (if indicated):     Assets:  Housing Leisure Time Physical Health Resilience Social Support  ADL's:  Intact  Cognition:  WNL  Sleep:       Mental Status Per Nursing Assessment::   On Admission:   depression  Demographic Factors:  NA  Loss Factors: Loss of significant relationship  Historical Factors: NA  Risk Reduction Factors:   Responsible for children under 76 years of age, Sense of responsibility to family, Living with another person, especially a relative, Positive social support and Positive therapeutic relationship  Continued Clinical Symptoms:  Depression, mild  Cognitive Features That Contribute To Risk:  None    Suicide Risk:  Minimal: No identifiable suicidal ideation.  Patients presenting with no risk factors but with morbid ruminations; may be classified as minimal risk based on the severity of the depressive symptoms    Plan Of Care/Follow-up recommendations:  Activity:  as tolerated Diet:  heart healthy diet  Cathline Dowen, NP 07/01/2016, 11:15 AM

## 2016-07-01 NOTE — BH Assessment (Signed)
[facility] Assessment Progress Note  Per Mojeed Akintayo, MD, this pt does not require psychiatric hospitalization at this time.  Pt is to be discharged from [facility] with recommendation to follow up with [facility].  This has been included in pt's discharge instructions.  Pt's nurse has been notified.  Kayde Warehime, MA Triage Specialist [phone]     
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
## 2016-07-01 NOTE — ED Provider Notes (Signed)
I did not see or evaluate the patient. The pt was discharged per the behavioral health team. I was asked to assist with the disposition button in [facility]   Azalia Bilis, MD 07/01/16 1321

## 2016-07-01 NOTE — ED Notes (Signed)
Pt expressed desire to be discharged prior to the arrival of her ride home. She denies SI/HI. She is not delusional or psychotic, not responding to internal stimuli. All belongings returned to pt who signed for same. Pt escorted to exit.

## 2016-07-01 NOTE — Discharge Instructions (Signed)
For your ongoing behavioral health needs you are advised to follow up with [facility].  New patients are seen at their walk-in clinic.  Walk-in hours are Monday - Friday from 8:00 am - 12:00 pm, and from 1:00 pm - 3:00 pm.  Walk-in patients are seen on a first come, first served basis, so try to arrive as early as possible for the best chance of being seen the same day.  There is an initial fee of $22.50: ° °     [facility] °     [address] °     [place], [place] [address] °     [phone] °

## 2017-10-31 DIAGNOSIS — K5730 Diverticulosis of large intestine without perforation or abscess without bleeding: Secondary | ICD-10-CM

## 2017-10-31 HISTORY — DX: Diverticulosis of large intestine without perforation or abscess without bleeding: K57.30

## 2017-11-20 ENCOUNTER — Other Ambulatory Visit: Payer: Self-pay

## 2017-11-20 ENCOUNTER — Emergency Department (HOSPITAL_BASED_OUTPATIENT_CLINIC_OR_DEPARTMENT_OTHER): Payer: Medicaid Other

## 2017-11-20 ENCOUNTER — Encounter (HOSPITAL_BASED_OUTPATIENT_CLINIC_OR_DEPARTMENT_OTHER): Payer: Self-pay

## 2017-11-20 ENCOUNTER — Emergency Department (HOSPITAL_BASED_OUTPATIENT_CLINIC_OR_DEPARTMENT_OTHER)
Admission: EM | Admit: 2017-11-20 | Discharge: 2017-11-21 | Disposition: A | Discharge status: Home / Self Care | Payer: Medicaid Other | Attending: Emergency Medicine | Admitting: Emergency Medicine

## 2017-11-20 DIAGNOSIS — K5732 Diverticulitis of large intestine without perforation or abscess without bleeding: Secondary | ICD-10-CM

## 2017-11-20 DIAGNOSIS — K5792 Diverticulitis of intestine, part unspecified, without perforation or abscess without bleeding: Secondary | ICD-10-CM

## 2017-11-20 DIAGNOSIS — K5712 Diverticulitis of small intestine without perforation or abscess without bleeding: Secondary | ICD-10-CM | POA: Insufficient documentation

## 2017-11-20 DIAGNOSIS — R1012 Left upper quadrant pain: Secondary | ICD-10-CM | POA: Diagnosis present

## 2017-11-20 HISTORY — DX: Diverticulitis of intestine, part unspecified, without perforation or abscess without bleeding: K57.92

## 2017-11-20 LAB — CBC WITH DIFFERENTIAL/PLATELET
BASOS ABS: 0 10*3/uL (ref 0.0–0.1)
Basophils Relative: 0 %
Eosinophils Absolute: 0.1 10*3/uL (ref 0.0–0.7)
Eosinophils Relative: 1 %
HEMATOCRIT: 35 % — AB (ref 36.0–46.0)
Hemoglobin: 11.6 g/dL — ABNORMAL LOW (ref 12.0–15.0)
LYMPHS PCT: 14 %
Lymphs Abs: 1.3 10*3/uL (ref 0.7–4.0)
MCH: 30.1 pg (ref 26.0–34.0)
MCHC: 33.1 g/dL (ref 30.0–36.0)
MCV: 90.7 fL (ref 78.0–100.0)
Monocytes Absolute: 0.7 10*3/uL (ref 0.1–1.0)
Monocytes Relative: 7 %
NEUTROS ABS: 7.6 10*3/uL (ref 1.7–7.7)
Neutrophils Relative %: 78 %
Platelets: 247 10*3/uL (ref 150–400)
RBC: 3.86 MIL/uL — AB (ref 3.87–5.11)
RDW: 12.3 % (ref 11.5–15.5)
WBC: 9.6 10*3/uL (ref 4.0–10.5)

## 2017-11-20 LAB — URINALYSIS, ROUTINE W REFLEX MICROSCOPIC
Bilirubin Urine: NEGATIVE
GLUCOSE, UA: NEGATIVE mg/dL
Ketones, ur: NEGATIVE mg/dL
Leukocytes, UA: NEGATIVE
Nitrite: NEGATIVE
PROTEIN: NEGATIVE mg/dL
Specific Gravity, Urine: <1.005 — ABNORMAL LOW (ref 1.005–1.030)
pH: 6.5 (ref 5.0–8.0)

## 2017-11-20 LAB — URINALYSIS, MICROSCOPIC (REFLEX): WBC, UA: NONE SEEN WBC/hpf (ref 0–5)

## 2017-11-20 LAB — PREGNANCY, URINE: PREG TEST UR: NEGATIVE

## 2017-11-20 MED ORDER — ONDANSETRON HCL 4 MG/2ML IJ SOLN
4.0000 mg | Freq: Once | INTRAMUSCULAR | Status: AC
Start: 2017-11-20 — End: 2017-11-21
  Administered 2017-11-21: 4 mg via INTRAVENOUS
  Filled 2017-11-20: qty 2

## 2017-11-20 MED ORDER — KETOROLAC TROMETHAMINE 30 MG/ML IJ SOLN
30.0000 mg | Freq: Once | INTRAMUSCULAR | Status: AC
  Administered 2017-11-21: 30 mg via INTRAVENOUS
  Filled 2017-11-20: qty 1

## 2017-11-20 NOTE — ED Provider Notes (Signed)
[facility] EMERGENCY DEPARTMENT Provider Note   CSN: [id number] Arrival date & time: 11/20/17  2121     History   Chief Complaint Chief Complaint  Patient presents with  . Abdominal Pain    HPI Alexa Gallagher is a 36 y.o. female.  HPI   36 year old female with history of anxiety overactive bladder presenting for evaluation of abdominal pain.  Patient reports developing left flank pain yesterday morning which has been present since.  Pain is described as a sharp sensation, starting her left flank and radiates to her left side of abdomen.  She endorsed nausea without vomiting.  Nothing seems to make pain better or worse.  Report having normal bowel movement.  Denies any fever but endorse some chills.  No chest pain or shortness of breath no productive cough.  Denies dysuria, hematuria, vaginal bleeding or vaginal discharge.  Able to pass flatus, and feel a bit bloated.  No history of kidney stones.  Did report prior cholecystectomy and incisional hernia repair.  She initially went to urgent care earlier today for her symptoms.  States that she did not receive any kind of labs or additional treatment aside from a prescription for nausea and omeprazole for GI problem.  Past Medical History:  Diagnosis Date  . Abnormal Pap smear   . Anxiety   . Incisional hernia   . Overactive bladder   . Pneumonia     Patient Active Problem List   Diagnosis Date Noted  . Major depressive disorder, recurrent episode, mild with anxious distress (HCC) 07/01/2016  . Active labor 02/06/2014  . Incisional hernia, without obstruction or gangrene 08/05/2013  . Supervision of other normal pregnancy 08/09/2012    Past Surgical History:  Procedure Laterality Date  . CHOLECYSTECTOMY    . COLPOSCOPY VULVA W/ BIOPSY     abnormal pap.  Marland Kitchen. HERNIA REPAIR      OB History    Gravida Para Term Preterm AB Living   3 3 3  0 0 3   SAB TAB Ectopic Multiple Live Births   0 0 0 0 3        Home Medications    Prior to Admission medications   Not on File    Family History Family History  Problem Relation Age of Onset  . Cancer Mother        cervical cancer  . Anxiety disorder Mother   . Hypertension Father   . Diabetes Father        boreline  . Alcohol abuse Maternal Grandmother   . Depression Maternal Grandmother   . Anxiety disorder Maternal Grandmother   . Heart disease Paternal Grandmother   . Sickle cell anemia Paternal Grandfather   . Anesthesia problems Neg Hx     Social History Social History   Tobacco Use  . Smoking status: Never Smoker  . Smokeless tobacco: Never Used  Substance Use Topics  . Alcohol use: Yes    Comment: occ  . Drug use: No     Allergies   Patient has no known allergies.   Review of Systems Review of Systems  All other systems reviewed and are negative.    Physical Exam Updated Vital Signs BP 118/82 (BP Location: Left Arm)   Pulse 98   Temp 98.9 F (37.2 C) (Oral)   Resp 16   Ht 5\' 10"  (1.778 m)   Wt 85.1 kg (187 lb 9.6 oz)   LMP 11/05/2017   SpO2 100%   BMI 26.92  kg/m    Physical Exam  Constitutional: She appears well-developed and well-nourished. No distress.  HENT:  Head: Atraumatic.  Eyes: Conjunctivae are normal.  Neck: Neck supple.  Cardiovascular: Normal rate and regular rhythm.  Pulmonary/Chest: Effort normal and breath sounds normal.  Abdominal: Soft. Normal appearance and bowel sounds are normal. There is tenderness in the left lower quadrant. There is CVA tenderness. There is no rebound, no guarding, no tenderness at McBurney's point and negative Murphy's sign. Hernia confirmed negative in the right inguinal area and confirmed negative in the left inguinal area.  Neurological: She is alert.  Skin: No rash noted.  Psychiatric: She has a normal mood and affect.  Nursing note and vitals reviewed.    ED Treatments / Results  Labs (all labs ordered are listed, but only abnormal results  are displayed) Labs Reviewed  URINALYSIS, ROUTINE W REFLEX MICROSCOPIC - Abnormal; Notable for the following components:      Result Value   Specific Gravity, Urine <1.005 (*)    Hgb urine dipstick SMALL (*)    All other components within normal limits  URINALYSIS, MICROSCOPIC (REFLEX) - Abnormal; Notable for the following components:   Bacteria, UA RARE (*)    Squamous Epithelial / LPF 0-5 (*)    All other components within normal limits  CBC WITH DIFFERENTIAL/PLATELET - Abnormal; Notable for the following components:   RBC 3.86 (*)    Hemoglobin 11.6 (*)    HCT 35.0 (*)    All other components within normal limits  COMPREHENSIVE METABOLIC PANEL - Abnormal; Notable for the following components:   Potassium 3.2 (*)    Glucose, Bld 121 (*)    Calcium 8.7 (*)    Total Protein 8.3 (*)    All other components within normal limits  PREGNANCY, URINE  LIPASE, BLOOD    EKG  EKG Interpretation None       Radiology Ct Renal Stone Study  Result Date: 11/21/2017 CLINICAL DATA:  36 year old female with history of left-sided abdominal pain. EXAM: CT ABDOMEN AND PELVIS WITHOUT CONTRAST TECHNIQUE: Multidetector CT imaging of the abdomen and pelvis was performed following the standard protocol without IV contrast. COMPARISON:  None. FINDINGS: Evaluation of this exam is limited in the absence of intravenous contrast. Lower chest: The visualized lung bases are clear. No intra-abdominal free air or free fluid. Hepatobiliary: The liver is unremarkable. No intrahepatic biliary ductal dilatation. Cholecystectomy. Pancreas: Unremarkable. No pancreatic ductal dilatation or surrounding inflammatory changes. Spleen: Normal in size without focal abnormality. Adrenals/Urinary Tract: Adrenal glands are unremarkable. Kidneys are normal, without renal calculi, focal lesion, or hydronephrosis. Bladder is unremarkable. Stomach/Bowel: There is extensive sigmoid diverticulosis. There are scattered colonic  diverticula. There is inflammatory changes of the mid descending colon centered at the diverticula most consistent with acute diverticulitis. No bowel obstruction. Normal appendix. Vascular/Lymphatic: The abdominal aorta and IVC are grossly unremarkable on this noncontrast CT. No portal venous gas. There is no adenopathy. Reproductive: The uterus is anteverted and grossly unremarkable. There is a 2.5 cm left ovarian dominant follicle/cyst. The right ovary appears unremarkable. Other: There is diastases of anterior abdominal wall musculature. A hernia repair mesh is noted. Musculoskeletal: No acute or significant osseous findings. IMPRESSION: 1. Diverticulitis of the mid descending colon. No abscess or evidence of perforation. 2. Extensive sigmoid diverticulosis, advanced for the patient's age. No bowel obstruction. Normal appendix. 3. No hydronephrosis or nephrolithiasis. Electronically Signed   By: Elgie CollardArash  Radparvar M.D.   On: 11/21/2017 00:32    Procedures  Procedures (including critical care time)  Medications Ordered in ED Medications  ketorolac (TORADOL) 30 MG/ML injection 30 mg (30 mg Intravenous Given 11/21/17 0018)  ondansetron (ZOFRAN) injection 4 mg (4 mg Intravenous Given 11/21/17 0016)     Initial Impression / Assessment and Plan / ED Course  I have reviewed the triage vital signs and the nursing notes.  Pertinent labs & imaging results that were available during my care of the patient were reviewed by me and considered in my medical decision making (see chart for details).     BP 118/82 (BP Location: Left Arm)   Pulse 98   Temp 98.9 F (37.2 C) (Oral)   Resp 16   Ht 5\' 10"  (1.778 m)   Wt 85.1 kg (187 lb 9.6 oz)   LMP 11/05/2017   SpO2 100%   BMI 26.92 kg/m    Final Clinical Impressions(s) / ED Diagnoses   Final diagnoses:  Diverticulitis of large intestine without perforation or abscess, unspecified bleeding status    ED Discharge Orders        Ordered     promethazine (PHENERGAN) 25 MG tablet  Every 6 hours PRN     11/21/17 0057    HYDROcodone-acetaminophen (NORCO/VICODIN) 5-325 MG tablet  Every 6 hours PRN     11/21/17 0057    ciprofloxacin (CIPRO) 500 MG tablet  2 times daily     11/21/17 0057    metroNIDAZOLE (FLAGYL) 500 MG tablet  2 times daily     11/21/17 0057    fluconazole (DIFLUCAN) 150 MG tablet   Once     11/21/17 0057     11:36 PM Patient here with left flank pain and left-sided abdominal pain.  Positive CVA tenderness on exam.  She is afebrile with stable vital sign.  Workup initiated, will obtain renal some study.  Her urine shows small amount of blood on urine dipstick.  Pregnancy test is negative.  No signs of urinary tract infection.  12:50 AM Labs are mostly reassuring, abdominal and pelvis CT scan demonstrate evidence of diverticulitis without perforation or abscess.  No evidence of kidney stone.  Patient will be treated with Cipro and Flagyl for 2 weeks. Pain medication prescribed as needed.  Return precaution discussed.  In order to decrease risk of narcotic abuse. Pt's record were checked using the [place] Controlled Substance database.    Fayrene Helper, PA-C 11/21/17 0058  Fayrene Helper, PA-C 11/21/17 Micki Riley, April, MD 11/21/17 [phone]

## 2017-11-20 NOTE — ED Triage Notes (Addendum)
C/o LLQ pain x 2 days-denies v/d-[facility]no tests done and rx phenergan, omeprazole-pt states she used "plan b just as a precaution" 12/15-NAD-steady gait

## 2017-11-20 NOTE — ED Notes (Signed)
Patient transported to CT 

## 2017-11-21 LAB — COMPREHENSIVE METABOLIC PANEL
ALT: 18 U/L (ref 14–54)
AST: 29 U/L (ref 15–41)
Albumin: 3.8 g/dL (ref 3.5–5.0)
Alkaline Phosphatase: 52 U/L (ref 38–126)
Anion gap: 8 (ref 5–15)
BILIRUBIN TOTAL: 0.6 mg/dL (ref 0.3–1.2)
BUN: 8 mg/dL (ref 6–20)
CO2: 25 mmol/L (ref 22–32)
CREATININE: 0.82 mg/dL (ref 0.44–1.00)
Calcium: 8.7 mg/dL — ABNORMAL LOW (ref 8.9–10.3)
Chloride: 103 mmol/L (ref 101–111)
Glucose, Bld: 121 mg/dL — ABNORMAL HIGH (ref 65–99)
Potassium: 3.2 mmol/L — ABNORMAL LOW (ref 3.5–5.1)
Sodium: 136 mmol/L (ref 135–145)
TOTAL PROTEIN: 8.3 g/dL — AB (ref 6.5–8.1)

## 2017-11-21 LAB — LIPASE, BLOOD: LIPASE: 18 U/L (ref 11–51)

## 2017-11-21 MED ORDER — CIPROFLOXACIN HCL 500 MG PO TABS: 500.0000 mg | ORAL_TABLET | Freq: Two times a day (BID) | ORAL | 0 refills | Status: DC

## 2017-11-21 MED ORDER — METRONIDAZOLE 500 MG PO TABS: 500.0000 mg | ORAL_TABLET | Freq: Two times a day (BID) | ORAL | 0 refills | Status: DC

## 2017-11-21 MED ORDER — HYDROCODONE-ACETAMINOPHEN 5-325 MG PO TABS: 1.0000 | ORAL_TABLET | Freq: Four times a day (QID) | ORAL | 0 refills | Status: DC | PRN

## 2017-11-21 MED ORDER — FLUCONAZOLE 150 MG PO TABS: 150.0000 mg | ORAL_TABLET | Freq: Once | ORAL | 0 refills | Status: AC

## 2017-11-21 MED ORDER — PROMETHAZINE HCL 25 MG PO TABS: 25.0000 mg | ORAL_TABLET | Freq: Four times a day (QID) | ORAL | 0 refills | Status: DC | PRN

## 2017-11-21 NOTE — Discharge Instructions (Signed)
Your pain is caused by diverticulitis.  Please take antibiotics as prescribed for the next 2 weeks.  Take vicodin as needed for pain, phenergan as needed for nausea.  If you develop yeast infection from the antibiotic then take diflucan once. Return if you have any concerns.  Follow up with your primary care provider for further care.

## 2018-12-10 ENCOUNTER — Other Ambulatory Visit: Payer: Self-pay | Admitting: Obstetrics and Gynecology

## 2019-01-05 ENCOUNTER — Encounter (HOSPITAL_BASED_OUTPATIENT_CLINIC_OR_DEPARTMENT_OTHER): Payer: Self-pay

## 2019-01-07 ENCOUNTER — Encounter (HOSPITAL_BASED_OUTPATIENT_CLINIC_OR_DEPARTMENT_OTHER): Payer: Self-pay

## 2019-01-07 ENCOUNTER — Other Ambulatory Visit: Payer: Self-pay

## 2019-01-07 NOTE — Progress Notes (Signed)
Spoke with: Elease Hashimoto NPO:  After Midnight, no gum, candy, or mints   Arrival time: [id number] Labs: UPT AM medications:  Bupropion, Pre surgery drink Pre op orders: Yes Ride home:  [place] [phone]

## 2019-01-12 ENCOUNTER — Ambulatory Visit (HOSPITAL_BASED_OUTPATIENT_CLINIC_OR_DEPARTMENT_OTHER): Payer: Managed Care, Other (non HMO) | Admitting: Anesthesiology

## 2019-01-12 ENCOUNTER — Ambulatory Visit (HOSPITAL_BASED_OUTPATIENT_CLINIC_OR_DEPARTMENT_OTHER)
Admission: RE | Admit: 2019-01-12 | Discharge: 2019-01-12 | Disposition: A | Discharge status: Home / Self Care | Payer: Managed Care, Other (non HMO) | Attending: Obstetrics and Gynecology | Admitting: Obstetrics and Gynecology

## 2019-01-12 ENCOUNTER — Encounter (HOSPITAL_BASED_OUTPATIENT_CLINIC_OR_DEPARTMENT_OTHER)
Admission: RE | Disposition: A | Discharge status: Home / Self Care | Payer: Self-pay | Source: Home / Self Care | Attending: Obstetrics and Gynecology

## 2019-01-12 ENCOUNTER — Encounter (HOSPITAL_BASED_OUTPATIENT_CLINIC_OR_DEPARTMENT_OTHER): Payer: Self-pay

## 2019-01-12 ENCOUNTER — Other Ambulatory Visit: Payer: Self-pay

## 2019-01-12 DIAGNOSIS — Z79899 Other long term (current) drug therapy: Secondary | ICD-10-CM | POA: Insufficient documentation

## 2019-01-12 DIAGNOSIS — F419 Anxiety disorder, unspecified: Secondary | ICD-10-CM | POA: Diagnosis not present

## 2019-01-12 DIAGNOSIS — N871 Moderate cervical dysplasia: Secondary | ICD-10-CM | POA: Diagnosis present

## 2019-01-12 DIAGNOSIS — F329 Major depressive disorder, single episode, unspecified: Secondary | ICD-10-CM | POA: Insufficient documentation

## 2019-01-12 HISTORY — DX: Personal history of other diseases of the digestive system: Z87.19

## 2019-01-12 HISTORY — PX: LEEP: SHX91

## 2019-01-12 HISTORY — DX: Major depressive disorder, single episode, unspecified: F32.9

## 2019-01-12 HISTORY — DX: Diverticulosis of large intestine without perforation or abscess without bleeding: K57.30

## 2019-01-12 HISTORY — DX: Diverticulitis of intestine, part unspecified, without perforation or abscess without bleeding: K57.92

## 2019-01-12 LAB — POCT PREGNANCY, URINE: Preg Test, Ur: NEGATIVE

## 2019-01-12 SURGERY — LEEP (LOOP ELECTROSURGICAL EXCISION PROCEDURE)
Anesthesia: General

## 2019-01-12 MED ORDER — WHITE PETROLATUM EX OINT
TOPICAL_OINTMENT | CUTANEOUS | Status: AC
  Filled 2019-01-12: qty 5

## 2019-01-12 MED ORDER — MIDAZOLAM HCL 2 MG/2ML IJ SOLN
INTRAMUSCULAR | Status: AC
  Filled 2019-01-12: qty 2

## 2019-01-12 MED ORDER — LIDOCAINE 2% (20 MG/ML) 5 ML SYRINGE
INTRAMUSCULAR | Status: DC | PRN
  Administered 2019-01-12: 60 mg via INTRAVENOUS

## 2019-01-12 MED ORDER — SCOPOLAMINE 1 MG/3DAYS TD PT72
MEDICATED_PATCH | TRANSDERMAL | Status: AC
  Filled 2019-01-12: qty 1

## 2019-01-12 MED ORDER — GLYCOPYRROLATE PF 0.2 MG/ML IJ SOSY
PREFILLED_SYRINGE | INTRAMUSCULAR | Status: AC
  Filled 2019-01-12: qty 1

## 2019-01-12 MED ORDER — DEXAMETHASONE SODIUM PHOSPHATE 10 MG/ML IJ SOLN
INTRAMUSCULAR | Status: DC | PRN
  Administered 2019-01-12: 10 mg via INTRAVENOUS

## 2019-01-12 MED ORDER — FENTANYL CITRATE (PF) 100 MCG/2ML IJ SOLN
INTRAMUSCULAR | Status: DC | PRN
  Administered 2019-01-12: 50 ug via INTRAVENOUS
  Administered 2019-01-12 (×2): 25 ug via INTRAVENOUS

## 2019-01-12 MED ORDER — ONDANSETRON HCL 4 MG/2ML IJ SOLN
INTRAMUSCULAR | Status: AC
  Filled 2019-01-12: qty 2

## 2019-01-12 MED ORDER — LIDOCAINE-EPINEPHRINE (PF) 1 %-1:200000 IJ SOLN
INTRAMUSCULAR | Status: DC | PRN
  Administered 2019-01-12: 10 mL

## 2019-01-12 MED ORDER — ACETAMINOPHEN 500 MG PO TABS
ORAL_TABLET | ORAL | Status: AC
  Filled 2019-01-12: qty 2

## 2019-01-12 MED ORDER — LACTATED RINGERS IV SOLN
INTRAVENOUS | Status: DC
  Administered 2019-01-12: 50 mL/h via INTRAVENOUS
  Filled 2019-01-12: qty 1000

## 2019-01-12 MED ORDER — MIDAZOLAM HCL 2 MG/2ML IJ SOLN
INTRAMUSCULAR | Status: DC | PRN
  Administered 2019-01-12: 2 mg via INTRAVENOUS

## 2019-01-12 MED ORDER — PROPOFOL 10 MG/ML IV BOLUS
INTRAVENOUS | Status: AC
  Filled 2019-01-12: qty 20

## 2019-01-12 MED ORDER — FENTANYL CITRATE (PF) 100 MCG/2ML IJ SOLN
25.0000 ug | INTRAMUSCULAR | Status: DC | PRN
  Filled 2019-01-12: qty 1

## 2019-01-12 MED ORDER — DEXAMETHASONE SODIUM PHOSPHATE 10 MG/ML IJ SOLN
INTRAMUSCULAR | Status: AC
  Filled 2019-01-12: qty 1

## 2019-01-12 MED ORDER — PROPOFOL 10 MG/ML IV BOLUS
INTRAVENOUS | Status: DC | PRN
  Administered 2019-01-12: 160 mg via INTRAVENOUS

## 2019-01-12 MED ORDER — FERRIC SUBSULFATE SOLN
Status: DC | PRN
  Administered 2019-01-12: 1

## 2019-01-12 MED ORDER — ACETAMINOPHEN 500 MG PO TABS
1000.0000 mg | ORAL_TABLET | Freq: Once | ORAL | Status: AC
  Administered 2019-01-12: 1000 mg via ORAL
  Filled 2019-01-12: qty 2

## 2019-01-12 MED ORDER — GLYCOPYRROLATE PF 0.2 MG/ML IJ SOSY
PREFILLED_SYRINGE | INTRAMUSCULAR | Status: DC | PRN
  Administered 2019-01-12: .2 mg via INTRAVENOUS

## 2019-01-12 MED ORDER — LIDOCAINE 2% (20 MG/ML) 5 ML SYRINGE
INTRAMUSCULAR | Status: AC
  Filled 2019-01-12: qty 5

## 2019-01-12 MED ORDER — KETOROLAC TROMETHAMINE 30 MG/ML IJ SOLN
INTRAMUSCULAR | Status: DC | PRN
  Administered 2019-01-12: 30 mg via INTRAVENOUS

## 2019-01-12 MED ORDER — ONDANSETRON HCL 4 MG/2ML IJ SOLN
INTRAMUSCULAR | Status: DC | PRN
  Administered 2019-01-12: 4 mg via INTRAVENOUS

## 2019-01-12 MED ORDER — FENTANYL CITRATE (PF) 100 MCG/2ML IJ SOLN
INTRAMUSCULAR | Status: AC
  Filled 2019-01-12: qty 2

## 2019-01-12 MED ORDER — ACETIC ACID 4% SOLUTION
Status: DC | PRN
  Administered 2019-01-12: 1 via TOPICAL

## 2019-01-12 MED ORDER — PROMETHAZINE HCL 25 MG/ML IJ SOLN
6.2500 mg | INTRAMUSCULAR | Status: DC | PRN
  Filled 2019-01-12: qty 1

## 2019-01-12 MED ORDER — SCOPOLAMINE 1 MG/3DAYS TD PT72
1.0000 | MEDICATED_PATCH | TRANSDERMAL | Status: DC
  Administered 2019-01-12: 1.5 mg via TRANSDERMAL
  Filled 2019-01-12: qty 1

## 2019-01-12 SURGICAL SUPPLY — 36 items
APL SWBSTK 6 STRL LF DISP (MISCELLANEOUS) ×1
APPLICATOR COTTON TIP 6 STRL (MISCELLANEOUS) ×1 IMPLANT
APPLICATOR COTTON TIP 6IN STRL (MISCELLANEOUS) ×2
CLEANER CAUTERY TIP 5X5 PAD (MISCELLANEOUS) IMPLANT
ELECT BALL LEEP 5MM RED (ELECTRODE) IMPLANT
ELECT LEEP 2.0X0.8 R2008 (MISCELLANEOUS) ×2
ELECT LLETZ BALL 5MM DISP (ELECTRODE) ×1 IMPLANT
ELECT LOOP LEEP RND 15X12 GRN (CUTTING LOOP)
ELECT LOOP LEEP RND 20X12 WHT (CUTTING LOOP)
ELECT REM PT RETURN 9FT ADLT (ELECTROSURGICAL) ×2
ELECTRODE LEEP 2.0X0.8 R2008 (MISCELLANEOUS) IMPLANT
ELECTRODE LOOP LP RND 15X12GRN (CUTTING LOOP) IMPLANT
ELECTRODE LOOP LP RND 20X12WHT (CUTTING LOOP) IMPLANT
ELECTRODE REM PT RTRN 9FT ADLT (ELECTROSURGICAL) ×1 IMPLANT
EXTENDER ELECT LOOP LEEP 10CM (CUTTING LOOP) IMPLANT
GLOVE BIO SURGEON STRL SZ7.5 (GLOVE) ×1 IMPLANT
GLOVE BIOGEL PI IND STRL 6.5 (GLOVE) ×2 IMPLANT
GLOVE BIOGEL PI IND STRL 7.0 (GLOVE) ×1 IMPLANT
GLOVE BIOGEL PI IND STRL 7.5 (GLOVE) IMPLANT
GLOVE BIOGEL PI INDICATOR 6.5 (GLOVE) ×2
GLOVE BIOGEL PI INDICATOR 7.0 (GLOVE) ×1
GLOVE BIOGEL PI INDICATOR 7.5 (GLOVE) ×1
GLOVE ECLIPSE 6.5 STRL STRAW (GLOVE) ×2 IMPLANT
GOWN STRL REUS W/TWL LRG LVL3 (GOWN DISPOSABLE) ×4 IMPLANT
HIBICLENS CHG 4% 4OZ BTL (MISCELLANEOUS) ×1 IMPLANT
NS IRRIG 1000ML POUR BTL (IV SOLUTION) ×2 IMPLANT
PACK VAGINAL MINOR WOMEN LF (CUSTOM PROCEDURE TRAY) ×2 IMPLANT
PAD CLEANER CAUTERY TIP 5X5 (MISCELLANEOUS) ×1
PAD OB MATERNITY 4.3X12.25 (PERSONAL CARE ITEMS) ×2 IMPLANT
PENCIL SMOKE EVAC W/HOLSTER (ELECTROSURGICAL) ×2 IMPLANT
SCOPETTES 8  STERILE (MISCELLANEOUS) ×2
SCOPETTES 8 STERILE (MISCELLANEOUS) ×2 IMPLANT
SUT ETHILON 3 0 PS 1 18 (SUTURE) IMPLANT
TOWEL OR 17X24 6PK STRL BLUE (TOWEL DISPOSABLE) ×3 IMPLANT
TUBE CONNECTING 12X1/4 (SUCTIONS) ×1 IMPLANT
YANKAUER SUCT BULB TIP NO VENT (SUCTIONS) ×1 IMPLANT

## 2019-01-12 NOTE — Brief Op Note (Signed)
01/12/2019  9:08 AM  PATIENT:  Alexa Gallagher  38 y.o. 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   Delay start of Pharmacological VTE agent (>24hrs) due to surgical blood loss or risk of bleeding: not applicable

## 2019-01-12 NOTE — Transfer of Care (Signed)
Immediate Anesthesia Transfer of Care Note  Patient: Alexa Gallagher  Procedure(s) Performed: Procedure(s) (LRB): LOOP ELECTROSURGICAL EXCISION PROCEDURE (LEEP) (N/A)  Patient Location: PACU  Anesthesia Type: General  Level of Consciousness: awake, oriented, sedated and patient cooperative  Airway & Oxygen Therapy: Patient Spontanous Breathing and Patient connected to face mask oxygen  Post-op Assessment: Report given to PACU RN and Post -op Vital signs reviewed and stable  Post vital signs: Reviewed and stable  Complications: No apparent anesthesia complications Last Vitals:  Vitals Value Taken Time  BP 121/86 01/12/2019  9:03 AM  Temp 36.8 C 01/12/2019  9:03 AM  Pulse 76 01/12/2019  9:10 AM  Resp 17 01/12/2019  9:10 AM  SpO2 99 % 01/12/2019  9:10 AM  Vitals shown include unvalidated device data.  Last Pain:  Vitals:   01/12/19 0903  TempSrc:   PainSc: 0-No pain      Patients Stated Pain Goal: 3 (01/12/19 4268)

## 2019-01-12 NOTE — Anesthesia Procedure Notes (Signed)
Procedure Name: LMA Insertion Date/Time: 01/12/2019 8:28 AM Performed by: Francie Massing, CRNA Pre-anesthesia Checklist: Patient identified, Emergency Drugs available, Suction available and Patient being monitored Patient Re-evaluated:Patient Re-evaluated prior to induction Oxygen Delivery Method: Circle system utilized Preoxygenation: Pre-oxygenation with 100% oxygen Induction Type: IV induction Ventilation: Mask ventilation without difficulty LMA: LMA inserted LMA Size: 4.0 Number of attempts: 1 Airway Equipment and Method: Bite block Placement Confirmation: positive ETCO2 Tube secured with: Tape Dental Injury: Teeth and Oropharynx as per pre-operative assessment

## 2019-01-12 NOTE — Discharge Instructions (Signed)
°  Post Anesthesia Home Care Instructions  Activity: Get plenty of rest for the remainder of the day. A responsible individual must stay with you for 24 hours following the procedure.  For the next 24 hours, DO NOT: -Drive a car -Advertising copywriter -Drink alcoholic beverages -Take any medication unless instructed by your physician -Make any legal decisions or sign important papers.  Meals: Start with liquid foods such as gelatin or soup. Progress to regular foods as tolerated. Avoid greasy, spicy, heavy foods. If nausea and/or vomiting occur, drink only clear liquids until the nausea and/or vomiting subsides. Call your physician if vomiting continues.  Special Instructions/Symptoms: Your throat may feel dry or sore from the anesthesia or the breathing tube placed in your throat during surgery. If this causes discomfort, gargle with warm salt water. The discomfort should disappear within 24 hours.  If you had a scopolamine patch placed behind your ear for the management of post- operative nausea and/or vomiting:  1. The medication in the patch is effective for 72 hours, after which it should be removed.  Wrap patch in a tissue and discard in the trash. Wash hands thoroughly with soap and water. 2. You may remove the patch earlier than 72 hours if you experience unpleasant side effects which may include dry mouth, dizziness or visual disturbances. 3. Avoid touching the patch. Wash your hands with soap and water after contact with the patch.    DISCHARGE INSTRUCTIONS:  The following instructions have been prepared to help you care for yourself upon your return home.   Personal hygiene:  Use sanitary pads for vaginal drainage, not tampons.  Shower the day after your procedure.  NO tub baths, pools or Jacuzzis for 2-3 weeks.  Wipe front to back after using the bathroom.  Activity and limitations:  Do NOT drive or operate any equipment for 24 hours. The effects of anesthesia are still  present and drowsiness may result.  Do NOT rest in bed all day.  Walking is encouraged.  Walk up and down stairs slowly.  You may resume your normal activity in one to two days or as indicated by your physician.  Sexual activity: NO intercourse for at least 2 weeks after the procedure, or as indicated by your physician.  Diet: Eat a light meal as desired this evening. You may resume your usual diet tomorrow.  Return to work: You may resume your work activities in one to two days or as indicated by your doctor.  What to expect after your surgery: Expect to have vaginal bleeding/discharge for 2-3 days and spotting for up to 10 days. It is not unusual to have soreness for up to 1-2 weeks. You may have a slight burning sensation when you urinate for the first day. Mild cramps may continue for a couple of days.  Call your doctor for any of the following:  Excessive vaginal bleeding, saturating and changing one pad every hour.  Inability to urinate 6 hours after discharge from hospital.  Pain not relieved by pain medication.  Fever of 100.4 F or greater.  Unusual vaginal discharge or odor.   NO ADVIL, ALEVE, MOTRIN, IBUPROFEN UNTIL 230 PM TODAY

## 2019-01-12 NOTE — Anesthesia Preprocedure Evaluation (Addendum)
Anesthesia Evaluation  Patient identified by MRN, date of birth, ID band Patient awake    Reviewed: Allergy & Precautions, NPO status , Patient's Chart, lab work & pertinent test results  History of Anesthesia Complications Negative for: history of anesthetic complications  Airway Mallampati: II  TM Distance: >3 FB Neck ROM: Full    Dental no notable dental hx. (+) Dental Advisory Given   Pulmonary neg pulmonary ROS,    Pulmonary exam normal        Cardiovascular negative cardio ROS Normal cardiovascular exam     Neuro/Psych PSYCHIATRIC DISORDERS Anxiety Depression negative neurological ROS     GI/Hepatic negative GI ROS, Neg liver ROS,   Endo/Other  negative endocrine ROS  Renal/GU negative Renal ROS     Musculoskeletal negative musculoskeletal ROS (+)   Abdominal   Peds  Hematology negative hematology ROS (+)   Anesthesia Other Findings Day of surgery medications reviewed with the patient.  Reproductive/Obstetrics                            Anesthesia Physical Anesthesia Plan  ASA: I  Anesthesia Plan: General   Post-op Pain Management:    Induction: Intravenous  PONV Risk Score and Plan: 4 or greater and Ondansetron, Dexamethasone and Scopolamine patch - Pre-op  Airway Management Planned: LMA  Additional Equipment:   Intra-op Plan:   Post-operative Plan: Extubation in OR  Informed Consent: I have reviewed the patients History and Physical, chart, labs and discussed the procedure including the risks, benefits and alternatives for the proposed anesthesia with the patient or authorized representative who has indicated his/her understanding and acceptance.     Dental advisory given  Plan Discussed with: CRNA and Anesthesiologist  Anesthesia Plan Comments:        Anesthesia Quick Evaluation

## 2019-01-12 NOTE — H&P (Signed)
38 y.o. [id number] presents for scheduled LEEP. Pts pap history includes:  LSIL 2014, pap neg 2015, ASCUS HPV neg 2016, ASCUS HPV+ 2017: colpo performed 09/30/2016: ECC and bx CIN 1- per [facility] cotest 38mo: ASCUS HPV+: repeat colposcopy performed 09/16/2017: benign endocervical glands, no TZ. Per [facility] cotest 12 mo- 10/2018: LSIL HPV+, per [facility] repeat colpo- performed 11/26/2018- ECC only- CIN1. Per [facility] given persistent >68yrs recommend LEEP.   Past Medical History:  Diagnosis Date  . Abnormal Pap smear   . Anxiety   . Diverticulitis 11/20/2017  . History of gallstones   . Incisional hernia 08/2013  . Major depression   . Overactive bladder   . Pneumonia 03/2008  . Sigmoid diverticulosis 10/2017   Past Surgical History:  Procedure Laterality Date  . CHOLECYSTECTOMY    . COLPOSCOPY VULVA W/ BIOPSY     abnormal pap.  Marland Kitchen HERNIA REPAIR      Social History   Socioeconomic History  . Marital status: Married    Spouse name: Not on file  . Number of children: Not on file  . Years of education: Not on file  . Highest education level: Not on file  Occupational History  . Not on file  Social Needs  . Financial resource strain: Not on file  . Food insecurity:    Worry: Not on file    Inability: Not on file  . Transportation needs:    Medical: Not on file    Non-medical: Not on file  Tobacco Use  . Smoking status: Never Smoker  . Smokeless tobacco: Never Used  Substance and Sexual Activity  . Alcohol use: Yes    Comment: occ  . Drug use: No  . Sexual activity: Yes    Birth control/protection: None  Lifestyle  . Physical activity:    Days per week: Not on file    Minutes per session: Not on file  . Stress: Not on file  Relationships  . Social connections:    Talks on phone: Not on file    Gets together: Not on file    Attends religious service: Not on file    Active member of club or organization: Not on file    Attends meetings of clubs or organizations: Not on file   Relationship status: Not on file  . Intimate partner violence:    Fear of current or ex partner: Not on file    Emotionally abused: Not on file    Physically abused: Not on file    Forced sexual activity: Not on file  Other Topics Concern  . Not on file  Social History Narrative  . Not on file    No current facility-administered medications on file prior to encounter.    Current Outpatient Medications on File Prior to Encounter  Medication Sig Dispense Refill  . Biotin 02725 MCG TABS Take by mouth.    Marland Kitchen buPROPion (WELLBUTRIN) 100 MG tablet Take 100 mg by mouth 2 (two) times daily.    . Cholecalciferol (VITAMIN D3 PO) Take 250 mg by mouth.    . vitamin C (ASCORBIC ACID) 250 MG tablet Take 500 mg by mouth daily.    . ciprofloxacin (CIPRO) 500 MG tablet Take 1 tablet (500 mg total) by mouth 2 (two) times daily. One po bid x 7 days 28 tablet 0    No Known Allergies  Vitals:   01/07/19 1109 01/12/19 0642  BP:  123/89  Pulse:  80  Resp:  16  Temp:  98.5  F (36.9 C)  TempSrc:  Oral  SpO2:  100%  Weight: 88 kg 88.7 kg  Height: 5\' 8"  (1.727 m)     Lungs: clear to ascultation Cor:  RRR Abdomen:  soft, nontender, nondistended. Ex:  no cords, erythema Pelvic:  Deferred to OR  A:  Persistent CIN 1 for LEEP   P: P: All risks, benefits and alternatives d/w patient and she desires to proceed with LEEP Routine pre-op care.  Philip Aspen

## 2019-01-12 NOTE — Anesthesia Postprocedure Evaluation (Signed)
Anesthesia Post Note  Patient: Alexa Gallagher  Procedure(s) Performed: LOOP ELECTROSURGICAL EXCISION PROCEDURE (LEEP) (N/A )     Patient location during evaluation: PACU Anesthesia Type: General Level of consciousness: sedated Pain management: pain level controlled Vital Signs Assessment: post-procedure vital signs reviewed and stable Respiratory status: spontaneous breathing and respiratory function stable Cardiovascular status: stable Postop Assessment: no apparent nausea or vomiting Anesthetic complications: no    Last Vitals:  Vitals:   01/12/19 0930 01/12/19 1015  BP: 122/80 (!) 120/93  Pulse: 73 80  Resp: 12 14  Temp:  36.8 C  SpO2: 97% 100%    Last Pain:  Vitals:   01/12/19 0945  TempSrc:   PainSc: 0-No pain                 Crissie Aloi DANIEL

## 2019-01-13 ENCOUNTER — Encounter (HOSPITAL_BASED_OUTPATIENT_CLINIC_OR_DEPARTMENT_OTHER): Payer: Self-pay | Admitting: Obstetrics and Gynecology

## 2019-01-27 NOTE — Op Note (Signed)
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  PROCEDURE:  Loop electrosurgical excision procedure.  SURGEON:  Allyn Kenner, DO  ANESTHESIA:  Local and MAC.  ESTIMATED BLOOD LOSS:  Less than 5 mL.  FINDINGS:  No visible acetowhite changes, normal appearing vulva, vagina, perineum and cervix.    LOCAL MEDICATIONS USED:  10 mL of 1% lidocaine for paracervical block.  SPECIMENS:  Endocervical curettage and portio of cervix.  DISPOSITION OF SPECIMEN:  Pathology.  COUNTS:  Completed.    FINDINGS:  As above.  COMPLICATIONS:  None.  CONDITION:  Stable to PACU.  DESCRIPTION OF PROCEDURE:  The patient was taken to the operating room where anesthesia was administered and found to be adequate.  She was prepped and draped in the normal sterile fashion in dorsal lithotomy position.  A coated speculum was placed and  good visualization of the cervix was obtained.  Dilute acetic acid was applied.  Colposcope was not utilized as prior colposcopy did not reveal gross lesions; however, the acetic acid was applied to see if any mild changes were noted.  Cervix appeared  normal.  A wide shallow loop was used to remove the portion of cervix.  During the excision process the specimen detached and it was not possible to tag the specimen accurately.  Therefore, the specimen was sent without any suture tag.  An ECC was then  collected and also sent separately.  A cautery ball was used to control any additional bleeding and cauterize remaining edges.  Excellent hemostasis was obtained.  Monsel solution was applied for further hemostasis.  Of note, a noncoated, because no  coated tenaculum  was available, was placed anteriorly prior to excision and great care taken to not allow cautery equipment to come in contact with it.  The tenaculum was removed at the conclusion of the procedure and pressure was used for hemostasis.   Once excellent hemostasis was obtained, all instruments were removed.  Also of note, prior to excision 10 mL of anesthesia were administered paracervically.  The patient tolerated the procedure well.  Sponge, lap and needle counts were correct x2.  The  patient was taken to recovery in stable condition.  TN/NUANCE  D:01/27/2019 T:01/27/2019 JOB:[id number]

## 2019-09-02 ENCOUNTER — Ambulatory Visit
Admission: RE | Admit: 2019-09-02 | Discharge: 2019-09-02 | Disposition: A | Discharge status: Home / Self Care | Payer: Managed Care, Other (non HMO) | Source: Ambulatory Visit | Attending: Family Medicine | Admitting: Family Medicine

## 2019-09-02 ENCOUNTER — Other Ambulatory Visit: Payer: Managed Care, Other (non HMO)

## 2019-09-02 ENCOUNTER — Other Ambulatory Visit: Payer: Self-pay | Admitting: Family Medicine

## 2019-09-02 ENCOUNTER — Other Ambulatory Visit: Payer: Self-pay

## 2019-09-02 DIAGNOSIS — R52 Pain, unspecified: Secondary | ICD-10-CM

## 2019-10-23 ENCOUNTER — Other Ambulatory Visit: Payer: Self-pay

## 2019-10-23 ENCOUNTER — Encounter (HOSPITAL_BASED_OUTPATIENT_CLINIC_OR_DEPARTMENT_OTHER): Payer: Self-pay | Admitting: Emergency Medicine

## 2019-10-23 ENCOUNTER — Emergency Department (HOSPITAL_BASED_OUTPATIENT_CLINIC_OR_DEPARTMENT_OTHER)
Admission: EM | Admit: 2019-10-23 | Discharge: 2019-10-23 | Disposition: A | Discharge status: Home / Self Care | Payer: Managed Care, Other (non HMO) | Attending: Emergency Medicine | Admitting: Emergency Medicine

## 2019-10-23 ENCOUNTER — Emergency Department (HOSPITAL_BASED_OUTPATIENT_CLINIC_OR_DEPARTMENT_OTHER): Payer: Managed Care, Other (non HMO)

## 2019-10-23 DIAGNOSIS — M67833 Other specified disorders of tendon, right wrist: Secondary | ICD-10-CM | POA: Diagnosis not present

## 2019-10-23 DIAGNOSIS — Z79899 Other long term (current) drug therapy: Secondary | ICD-10-CM | POA: Diagnosis not present

## 2019-10-23 DIAGNOSIS — M778 Other enthesopathies, not elsewhere classified: Secondary | ICD-10-CM

## 2019-10-23 DIAGNOSIS — M25531 Pain in right wrist: Secondary | ICD-10-CM | POA: Diagnosis present

## 2019-10-23 MED ORDER — PREDNISONE 20 MG PO TABS: 40.0000 mg | ORAL_TABLET | Freq: Every day | ORAL | 0 refills | Status: DC

## 2019-10-23 MED ORDER — IBUPROFEN 400 MG PO TABS: 400.0000 mg | ORAL_TABLET | Freq: Three times a day (TID) | ORAL | 0 refills | Status: DC | PRN

## 2019-10-23 NOTE — ED Triage Notes (Signed)
R wrist pain since yesterday. No known injury.

## 2019-10-23 NOTE — ED Provider Notes (Signed)
[facility] EMERGENCY DEPARTMENT Provider Note   CSN: [id number] Arrival date & time: 10/23/19  0750     History   Chief Complaint Chief Complaint  Patient presents with   Wrist Pain    HPI Alexa Gallagher is a 38 y.o. female.     HPI Patient presents with right wrist pain.  Began yesterday.  States she did yoga couple days ago and wonders if this could have caused the pain although she did not have any injury at the time.  It is on the dorsal thumb area of the wrist.  Worse with movement.  No fevers or chills.  No trauma.  No pain in the elbow or shoulder.  Has not had pains in this before. Past Medical History:  Diagnosis Date   Abnormal Pap smear    Anxiety    Diverticulitis 11/20/2017   History of gallstones    Incisional hernia 08/2013   Major depression    Overactive bladder    Pneumonia 03/2008   Sigmoid diverticulosis 10/2017    Patient Active Problem List   Diagnosis Date Noted   Major depressive disorder, recurrent episode, mild with anxious distress ([place]) 07/01/2016   Active labor 02/06/2014   Incisional hernia, without obstruction or gangrene 08/05/2013   Supervision of other normal pregnancy 08/09/2012    Past Surgical History:  Procedure Laterality Date   CHOLECYSTECTOMY     COLPOSCOPY VULVA W/ BIOPSY     abnormal pap.   HERNIA REPAIR     LEEP N/A 01/12/2019   Procedure: LOOP ELECTROSURGICAL EXCISION PROCEDURE (LEEP);  Surgeon: Allyn Kenner, DO;  Location: [facility];  Service: Gynecology;  Laterality: N/A;     OB History    Gravida  3   Para  3   Term  3   Preterm  0   AB  0   Living  3     SAB  0   TAB  0   Ectopic  0   Multiple  0   Live Births  3            Home Medications    Prior to Admission medications   Medication Sig Start Date End Date Taking? Authorizing Provider  Biotin 10000 MCG TABS Take by mouth.    [provider]  buPROPion (WELLBUTRIN)  100 MG tablet Take 100 mg by mouth 2 (two) times daily.    [provider]  Cholecalciferol (VITAMIN D3 PO) Take 250 mg by mouth.    [provider]  ibuprofen (ADVIL) 400 MG tablet Take 1 tablet (400 mg total) by mouth every 8 (eight) hours as needed. 10/23/19   Davonna Belling, MD  predniSONE (DELTASONE) 20 MG tablet Take 2 tablets (40 mg total) by mouth daily. 10/23/19   Davonna Belling, MD  vitamin C (ASCORBIC ACID) 250 MG tablet Take 500 mg by mouth daily.    [provider]    Family History Family History  Problem Relation Age of Onset   Cancer Mother        cervical cancer   Anxiety disorder Mother    Hypertension Father    Diabetes Father        boreline   Alcohol abuse Maternal Grandmother    Depression Maternal Grandmother    Anxiety disorder Maternal Grandmother    Heart disease Paternal Grandmother    Sickle cell anemia Paternal Grandfather    Anesthesia problems Neg Hx     Social History  Social History   Tobacco Use   Smoking status: Never Smoker   Smokeless tobacco: Never Used  Substance Use Topics   Alcohol use: Yes    Comment: occ   Drug use: No     Allergies   Patient has no known allergies.   Review of Systems Review of Systems  Constitutional: Negative for appetite change.  Musculoskeletal:       Right wrist pain.  Skin: Negative for rash.  Neurological: Negative for weakness and numbness.     Physical Exam Updated Vital Signs BP 132/85 (BP Location: Right Arm)    Pulse 81    Temp 98.6 F (37 C) (Oral)    Resp 16    Ht 5\' 8"  (1.727 m)    Wt 86.2 kg    LMP 10/03/2019    SpO2 100%    BMI 28.89 kg/m   Physical Exam Vitals signs reviewed.  Musculoskeletal:     Comments: Tenderness over dorsal and lateral aspect of the wrist.  Less pain with flexion and extension of the fingers but does have pain with flexion extension and abduction abduction at the wrist.  Some pain with pulling the thumb into the  hand but not as severe as direct palpation on the wrist.  Skin:    Capillary Refill: Capillary refill takes less than 2 seconds.  Neurological:     Mental Status: She is alert.      ED Treatments / Results  Labs (all labs ordered are listed, but only abnormal results are displayed) Labs Reviewed - No data to display  EKG None  Radiology Dg Wrist Complete Right  Result Date: 10/23/2019 CLINICAL DATA:  Wrist pain since yesterday without trauma. EXAM: RIGHT WRIST - COMPLETE 3+ VIEW COMPARISON:  None. FINDINGS: No acute fracture or dislocation. Scaphoid intact. There may be minimal calcification within the triangular fibrocartilage. Joint spaces maintained. IMPRESSION: No acute osseous abnormality. Electronically Signed   By: 10/25/2019 M.D.   On: 10/23/2019 09:39    Procedures Procedures (including critical care time)  Medications Ordered in ED Medications - No data to display   Initial Impression / Assessment and Plan / ED Course  I have reviewed the triage vital signs and the nursing notes.  Pertinent labs & imaging results that were available during my care of the patient were reviewed by me and considered in my medical decision making (see chart for details).        Patient with right wrist pain.  On the lateral aspect.  Has reassuring x-ray overall.  Does have some symptoms of de Quervain's.  Thumb spica given.  Will treat with steroids and anti-inflammatories.  Follow-up with sports medicine.  Final Clinical Impressions(s) / ED Diagnoses   Final diagnoses:  Wrist tendonitis    ED Discharge Orders         Ordered    predniSONE (DELTASONE) 20 MG tablet  Daily     10/23/19 1009    ibuprofen (ADVIL) 400 MG tablet  Every 8 hours PRN     10/23/19 1009           10/25/19, MD 10/23/19 1013

## 2019-10-23 NOTE — ED Notes (Signed)
ED Provider at bedside. 

## 2020-07-25 ENCOUNTER — Encounter (HOSPITAL_BASED_OUTPATIENT_CLINIC_OR_DEPARTMENT_OTHER): Payer: Self-pay | Admitting: Emergency Medicine

## 2020-07-25 ENCOUNTER — Other Ambulatory Visit: Payer: Self-pay

## 2020-07-25 ENCOUNTER — Emergency Department (HOSPITAL_BASED_OUTPATIENT_CLINIC_OR_DEPARTMENT_OTHER)
Admission: EM | Admit: 2020-07-25 | Discharge: 2020-07-25 | Disposition: A | Discharge status: Home / Self Care | Payer: Managed Care, Other (non HMO) | Attending: Emergency Medicine | Admitting: Emergency Medicine

## 2020-07-25 DIAGNOSIS — Y998 Other external cause status: Secondary | ICD-10-CM | POA: Diagnosis not present

## 2020-07-25 DIAGNOSIS — X500XXA Overexertion from strenuous movement or load, initial encounter: Secondary | ICD-10-CM | POA: Diagnosis not present

## 2020-07-25 DIAGNOSIS — M542 Cervicalgia: Secondary | ICD-10-CM | POA: Insufficient documentation

## 2020-07-25 DIAGNOSIS — S46811A Strain of other muscles, fascia and tendons at shoulder and upper arm level, right arm, initial encounter: Secondary | ICD-10-CM

## 2020-07-25 DIAGNOSIS — Y929 Unspecified place or not applicable: Secondary | ICD-10-CM | POA: Diagnosis not present

## 2020-07-25 DIAGNOSIS — Y93F2 Activity, caregiving, lifting: Secondary | ICD-10-CM | POA: Diagnosis not present

## 2020-07-25 MED ORDER — CYCLOBENZAPRINE HCL 10 MG PO TABS: 10.0000 mg | ORAL_TABLET | Freq: Two times a day (BID) | ORAL | 0 refills | Status: AC | PRN

## 2020-07-25 MED ORDER — PREDNISONE 50 MG PO TABS: 50.0000 mg | ORAL_TABLET | Freq: Every day | ORAL | 0 refills | Status: AC

## 2020-07-25 MED ORDER — IBUPROFEN 400 MG PO TABS
600.0000 mg | ORAL_TABLET | Freq: Once | ORAL | Status: AC
  Administered 2020-07-25: 600 mg via ORAL
  Filled 2020-07-25: qty 1

## 2020-07-25 MED ORDER — IBUPROFEN 600 MG PO TABS: 600.0000 mg | ORAL_TABLET | Freq: Four times a day (QID) | ORAL | 0 refills | Status: AC | PRN

## 2020-07-25 NOTE — ED Provider Notes (Signed)
[facility] EMERGENCY DEPARTMENT Provider Note   CSN: [id number] Arrival date & time: 07/25/20  1037     History Chief Complaint  Patient presents with  . Shoulder Pain    Alexa Gallagher is a 39 y.o. female with no relevant past medical history presents the ED with complaints of right shoulder pain x1 week. Patient reports that she works for [employer] and last week she remembers lifting a large box and then immediately feeling discomfort in her right trapezial region radiating down her right upper arm. She describes the radiation of pain as a sharp "tingly" pain.  She states that her symptoms have been intermittent, however today they have been more persistent and bothersome. She states that her pain is reproducible with movement. She denies any fevers or chills, shortness of breath or cough, pain with inspiration, right upper quadrant abdominal pain, nausea or vomiting, weakness or numbness, or other symptoms.  HPI     Past Medical History:  Diagnosis Date  . Abnormal Pap smear   . Anxiety   . Diverticulitis 11/20/2017  . History of gallstones   . Incisional hernia 08/2013  . Major depression   . Overactive bladder   . Pneumonia 03/2008  . Sigmoid diverticulosis 10/2017    Patient Active Problem List   Diagnosis Date Noted  . Major depressive disorder, recurrent episode, mild with anxious distress ([facility]) 07/01/2016  . Active labor 02/06/2014  . Incisional hernia, without obstruction or gangrene 08/05/2013  . Supervision of other normal pregnancy 08/09/2012    Past Surgical History:  Procedure Laterality Date  . CHOLECYSTECTOMY    . COLPOSCOPY VULVA W/ BIOPSY     abnormal pap.  Marland Kitchen HERNIA REPAIR    . LEEP N/A 01/12/2019   Procedure: LOOP ELECTROSURGICAL EXCISION PROCEDURE (LEEP);  Surgeon: Philip Aspen, DO;  Location: [facility];  Service: Gynecology;  Laterality: N/A;     OB History    Gravida  3   Para  3   Term  3   Preterm  0     AB  0   Living  3     SAB  0   TAB  0   Ectopic  0   Multiple  0   Live Births  3           Family History  Problem Relation Age of Onset  . Cancer Mother        cervical cancer  . Anxiety disorder Mother   . Hypertension Father   . Diabetes Father        boreline  . Alcohol abuse Maternal Grandmother   . Depression Maternal Grandmother   . Anxiety disorder Maternal Grandmother   . Heart disease Paternal Grandmother   . Sickle cell anemia Paternal Grandfather   . Anesthesia problems Neg Hx     Social History   Tobacco Use  . Smoking status: Never Smoker  . Smokeless tobacco: Never Used  Vaping Use  . Vaping Use: Never used  Substance Use Topics  . Alcohol use: Yes    Comment: occ  . Drug use: No    Home Medications Prior to Admission medications   Medication Sig Start Date End Date Taking? Authorizing Provider  Biotin 15830 MCG TABS Take by mouth.    [provider]  buPROPion (WELLBUTRIN) 100 MG tablet Take 100 mg by mouth 2 (two) times daily.    [provider]  Cholecalciferol (VITAMIN D3 PO) Take 250 mg  by mouth.    [provider]  cyclobenzaprine (FLEXERIL) 10 MG tablet Take 1 tablet (10 mg total) by mouth 2 (two) times daily as needed for muscle spasms. 07/25/20   Lorelee New, PA-C  ibuprofen (ADVIL) 600 MG tablet Take 1 tablet (600 mg total) by mouth every 6 (six) hours as needed for moderate pain. 07/25/20   Lorelee New, PA-C  predniSONE (DELTASONE) 50 MG tablet Take 1 tablet (50 mg total) by mouth daily with breakfast for 5 days. 07/25/20 07/30/20  Lorelee New, PA-C  vitamin C (ASCORBIC ACID) 250 MG tablet Take 500 mg by mouth daily.    [provider]    Allergies    Patient has no known allergies.  Review of Systems   Review of Systems  Constitutional: Negative for chills and fever.  Musculoskeletal: Positive for arthralgias and neck pain. Negative for joint swelling.  Skin: Negative for  color change and wound.  Neurological: Negative for weakness and numbness.    Physical Exam Updated Vital Signs BP 120/90   Pulse 76   Temp 98.1 F (36.7 C) (Oral)   Resp 18   SpO2 100%   Physical Exam Vitals and nursing note reviewed. Exam conducted with a chaperone present.  Constitutional:      General: She is not in acute distress.    Appearance: She is not ill-appearing.  HENT:     Head: Normocephalic and atraumatic.  Eyes:     General: No scleral icterus.    Conjunctiva/sclera: Conjunctivae normal.  Neck:     Comments: No meningismus. No significant discomfort with looking in ipsilateral direction. Pain with turning head in contralateral direction. TTP over right trapezial region. No midline cervical TTP. Cardiovascular:     Rate and Rhythm: Normal rate and regular rhythm.     Pulses: Normal pulses.     Heart sounds: Normal heart sounds.  Pulmonary:     Effort: Pulmonary effort is normal. No respiratory distress.     Breath sounds: Normal breath sounds.  Abdominal:     General: Abdomen is flat. There is no distension.     Palpations: Abdomen is soft.     Comments: No RUQ TTP.  Musculoskeletal:     Cervical back: Normal range of motion. No rigidity.     Comments: TTP appreciated over right trapezial region and right deltoid. No significant bony TTP. No joint swelling, warmth, or overlying skin changes. ROM limited due to pain, however can abduct mildly against resistance. Peripheral pulses intact and symmetric. Sensation assessed and intact throughout. No elbow or wrist involvement. No clavicular TTP.  Skin:    General: Skin is dry.  Neurological:     Mental Status: She is alert.     GCS: GCS eye subscore is 4. GCS verbal subscore is 5. GCS motor subscore is 6.  Psychiatric:        Mood and Affect: Mood normal.        Behavior: Behavior normal.        Thought Content: Thought content normal.     ED Results / Procedures / Treatments   Labs (all labs ordered  are listed, but only abnormal results are displayed) Labs Reviewed - No data to display  EKG None  Radiology No results found.  Procedures Procedures (including critical care time)  Medications Ordered in ED Medications  ibuprofen (ADVIL) tablet 600 mg (600 mg Oral Given 07/25/20 1130)    ED Course  I have reviewed the triage  vital signs and the nursing notes.  Pertinent labs & imaging results that were available during my care of the patient were reviewed by me and considered in my medical decision making (see chart for details).    MDM Rules/Calculators/A&P                          Patient's history and physical exam is consistent with a right-sided trapezial strain subsequent to lifting a heavy box at work. Patient is endorsing a neuropathic radiation of symptoms into right upper arm/shoulder. There is no bony tenderness to palpation, swelling to suggest joint effusion, or other concern for osseous abnormality/dislocation. Do not feel as though imaging is warranted at this time. She denies any history of IVDA, fevers or chills, recent primary infection, or other concerns for possible septic arthritis subsequent to hematogenous spread. No RUQ abdominal pain and low suspicion for referred pain from gallbladder or diaphragm. Her symptoms are readily reproducible with MSK movement. Will treat with muscle relaxants and NSAIDs. Last menses started 1 week ago and she denies possibility of pregnancy. Will administer 600 mg ibuprofen here in the ED and discharged home with NSAIDs and muscle relaxants. Sling provided for comfort.    Strict ED return precautions discussed.  All of the evaluation and work-up results were discussed with the patient and any family at bedside.  Patient and/or family were informed that while patient is appropriate for discharge at this time, some medical emergencies may only develop or become detectable after a period of time.  I specifically instructed patient and/or  family to return to return to the ED or seek immediate medical attention for any new or worsening symptoms.  They were provided opportunity to ask any additional questions and have none at this time.  Prior to discharge patient is feeling well, agreeable with plan for discharge home.  They have expressed understanding of verbal discharge instructions as well as return precautions and are agreeable to the plan.   Patient counseled to never drive or operate heavy machinery while taking narcotic or other sedating medication.  Final Clinical Impression(s) / ED Diagnoses Final diagnoses:  Trapezius strain, right, initial encounter    Rx / DC Orders ED Discharge Orders         Ordered    ibuprofen (ADVIL) 600 MG tablet  Every 6 hours PRN        07/25/20 1133    cyclobenzaprine (FLEXERIL) 10 MG tablet  2 times daily PRN        07/25/20 1133    predniSONE (DELTASONE) 50 MG tablet  Daily with breakfast        07/25/20 1207           Lorelee New, PA-C 07/25/20 1207    Melene Plan, DO 07/25/20 1211

## 2020-07-25 NOTE — ED Triage Notes (Signed)
Pt here with right shoulder pain x 1 week after lifting a box at work. Pain has gotten worse since then. Shoots down arm.

## 2020-07-25 NOTE — Discharge Instructions (Signed)
You were given a prescription for Flexeril which is a muscle relaxer.  You should not drive, work, consume alcohol, or operate machinery while taking this medication as it can make you very drowsy.    Do not wear sling at all times - I strongly encourage light stretching exercises.  Please read the attachment on muscles strains.  Please follow-up with your primary care provider regarding today's encounter and for ongoing evaluation management.  You may benefit from referral to orthopedist should your symptoms fail to improve with conservative therapy.  Please return to the ED or seek immediate medical attention should you experience any new or worsening symptoms.

## 2020-12-25 ENCOUNTER — Ambulatory Visit (HOSPITAL_COMMUNITY): Payer: Self-pay | Admitting: Licensed Clinical Social Worker

## 2021-05-02 IMAGING — CR DG KNEE COMPLETE 4+V*L*
4 series · 4 of 4 positions shown · non-contrast
Comparison: None.

CLINICAL DATA: Knee pain

EXAM:
LEFT KNEE - COMPLETE 4+ VIEW

[w knee ap left]
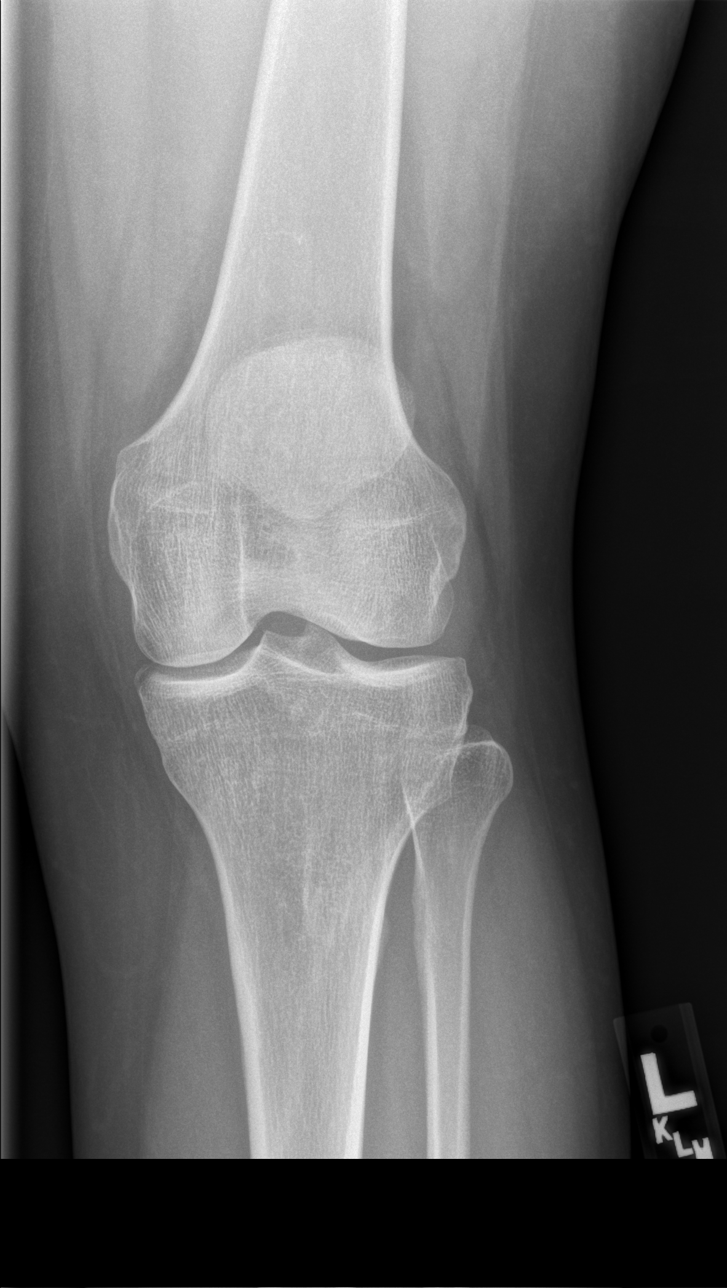

[w knee lat left]
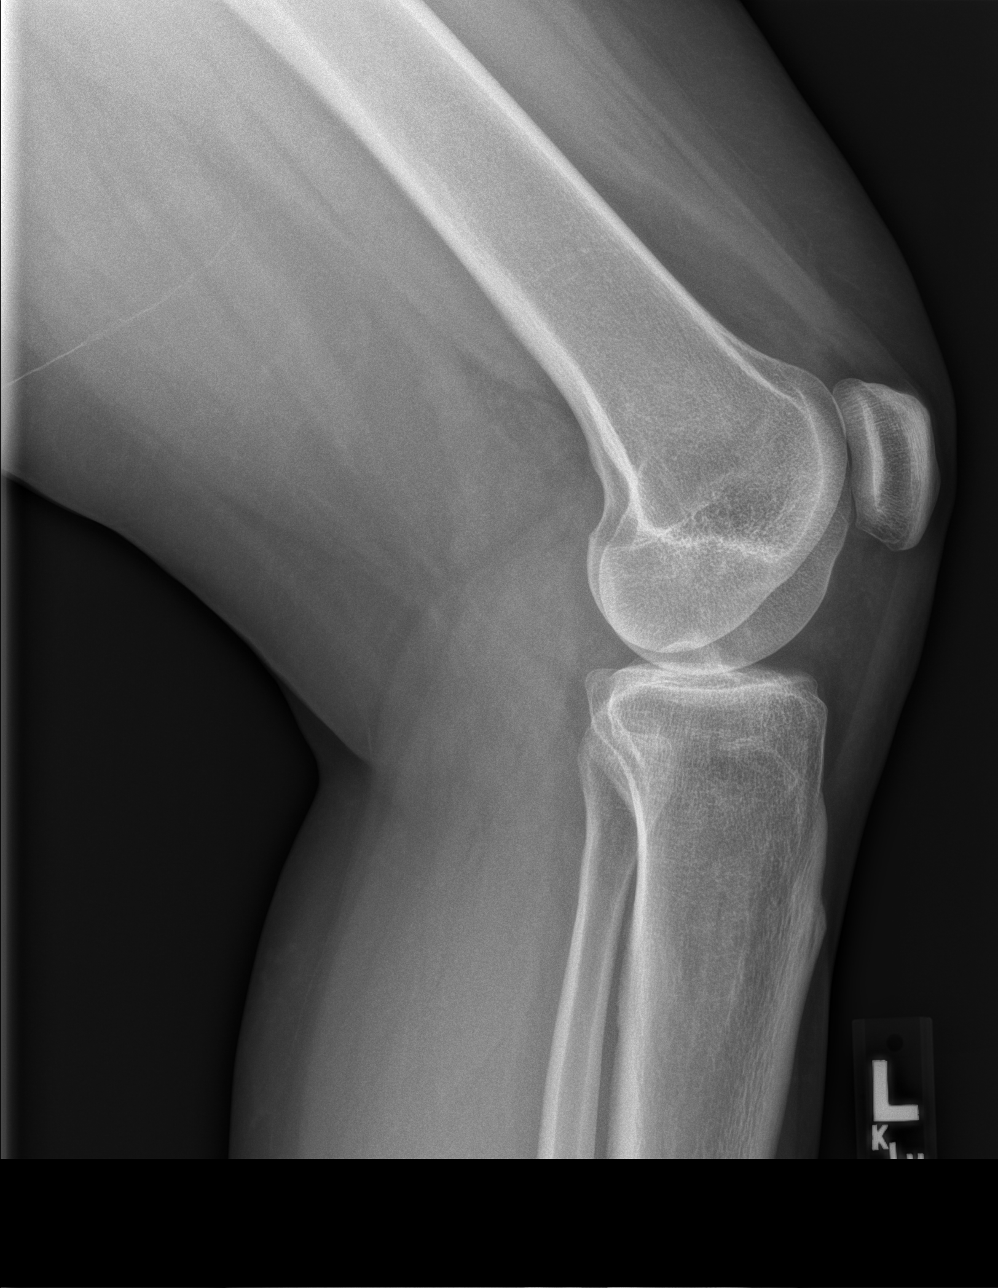

[w knee tunnel pa left]
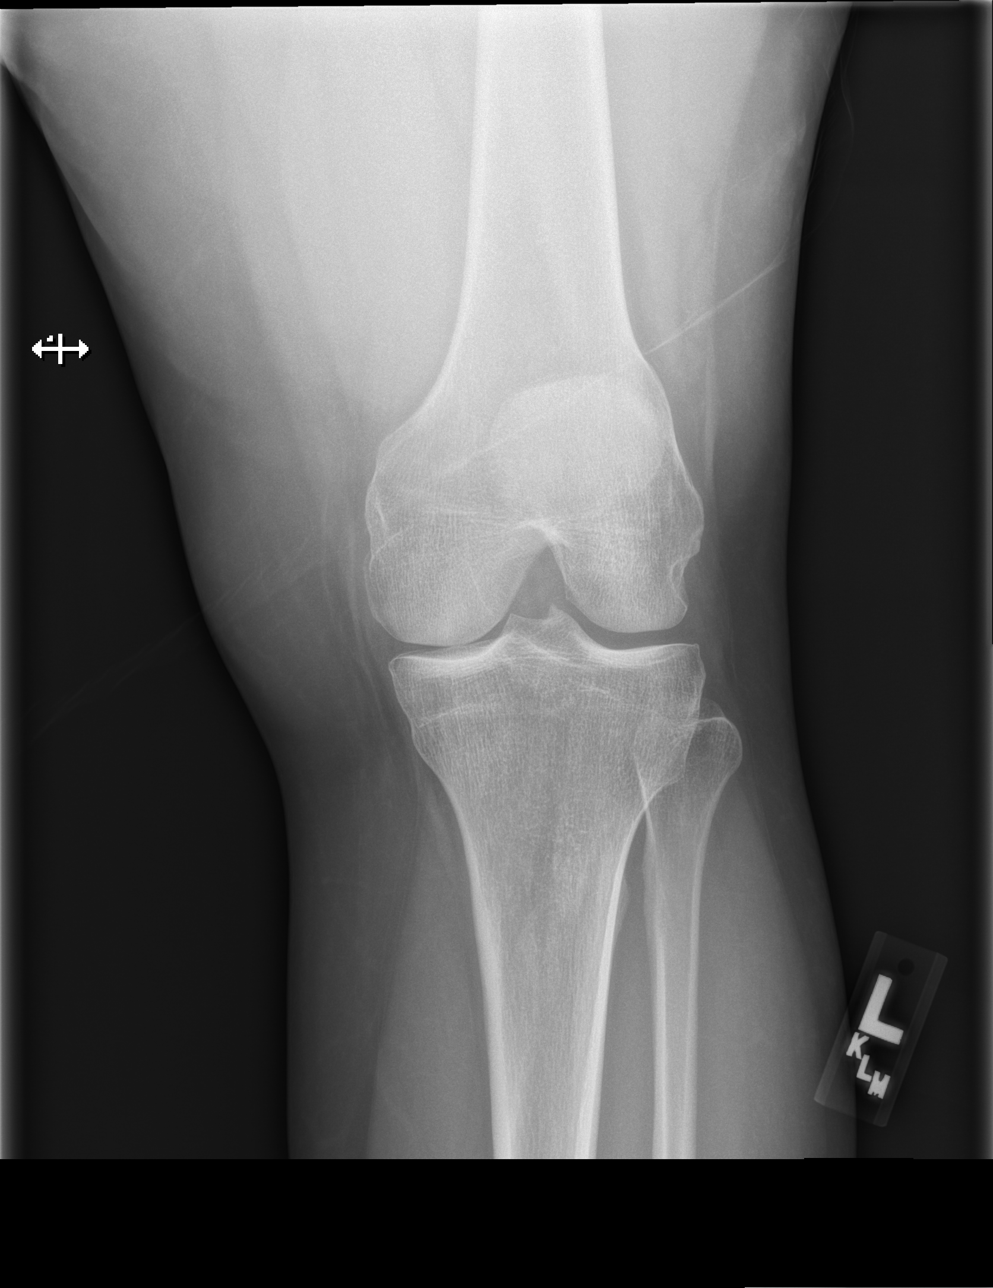

[x knee sunrise left]
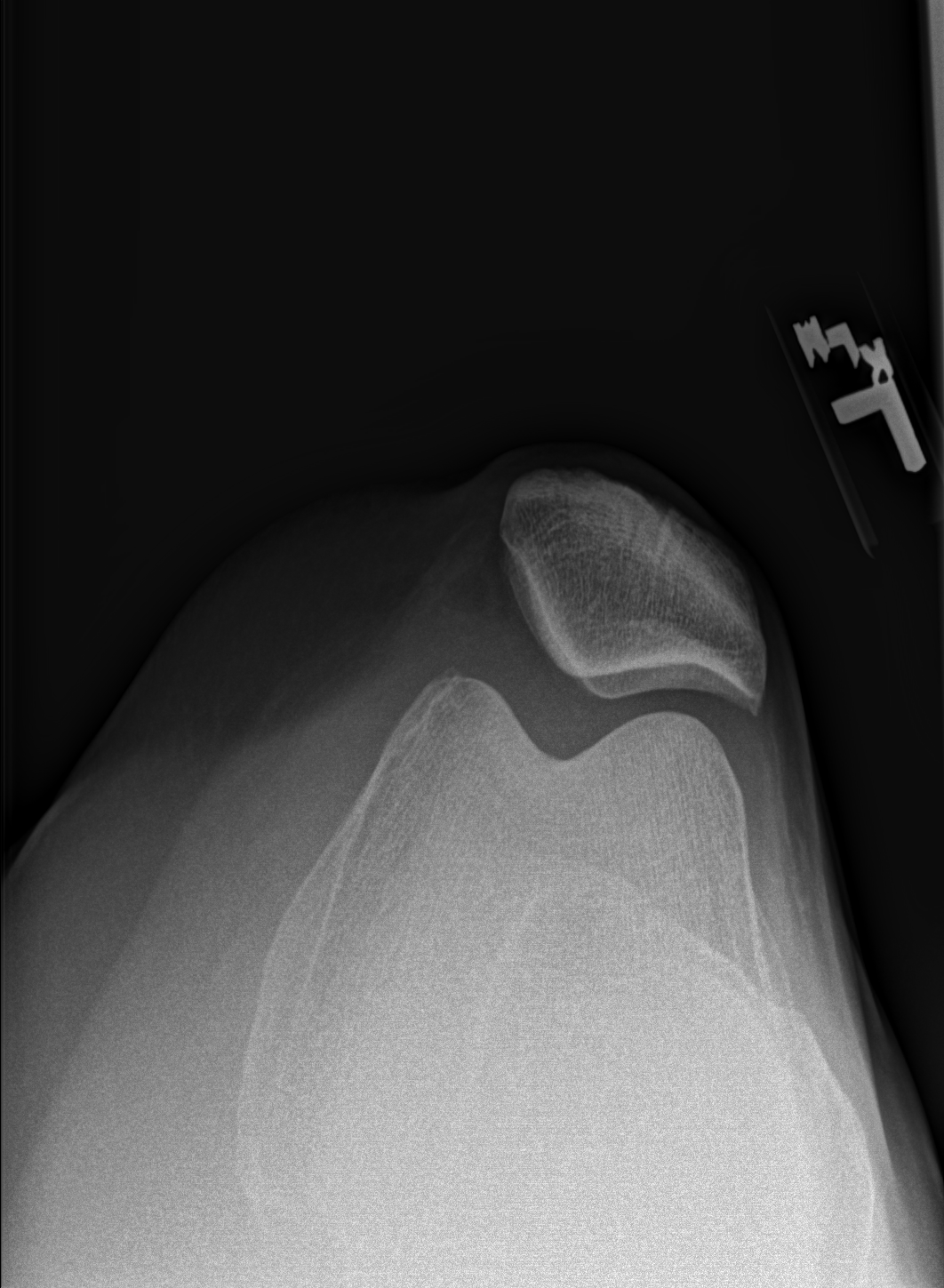

[4 of 4 positions shown; findings below may reference images not displayed]

FINDINGS: No fracture or malalignment. Mild medial joint space narrowing. No
significant knee effusion.
IMPRESSION: Minimal degenerative change.  No acute osseous abnormality

## 2022-12-01 DIAGNOSIS — Z419 Encounter for procedure for purposes other than remedying health state, unspecified: Secondary | ICD-10-CM | POA: Diagnosis not present

## 2023-01-01 DIAGNOSIS — Z419 Encounter for procedure for purposes other than remedying health state, unspecified: Secondary | ICD-10-CM | POA: Diagnosis not present

## 2023-01-30 DIAGNOSIS — Z419 Encounter for procedure for purposes other than remedying health state, unspecified: Secondary | ICD-10-CM | POA: Diagnosis not present

## 2023-03-02 DIAGNOSIS — Z419 Encounter for procedure for purposes other than remedying health state, unspecified: Secondary | ICD-10-CM | POA: Diagnosis not present

## 2023-04-01 DIAGNOSIS — Z419 Encounter for procedure for purposes other than remedying health state, unspecified: Secondary | ICD-10-CM | POA: Diagnosis not present

## 2023-04-14 ENCOUNTER — Telehealth: Payer: Self-pay

## 2023-04-14 NOTE — Telephone Encounter (Signed)
LVM for patient to call back. AS, CMA 

## 2023-07-02 ENCOUNTER — Ambulatory Visit (INDEPENDENT_AMBULATORY_CARE_PROVIDER_SITE_OTHER): Payer: BC Managed Care – PPO

## 2023-07-02 ENCOUNTER — Ambulatory Visit
Admission: EM | Admit: 2023-07-02 | Discharge: 2023-07-02 | Disposition: A | Discharge status: Home / Self Care | Payer: BC Managed Care – PPO | Attending: Internal Medicine | Admitting: Internal Medicine

## 2023-07-02 DIAGNOSIS — Z113 Encounter for screening for infections with a predominantly sexual mode of transmission: Secondary | ICD-10-CM | POA: Diagnosis not present

## 2023-07-02 DIAGNOSIS — R0602 Shortness of breath: Secondary | ICD-10-CM

## 2023-07-02 DIAGNOSIS — Z1152 Encounter for screening for COVID-19: Secondary | ICD-10-CM | POA: Diagnosis not present

## 2023-07-02 DIAGNOSIS — B349 Viral infection, unspecified: Secondary | ICD-10-CM | POA: Insufficient documentation

## 2023-07-02 DIAGNOSIS — R1031 Right lower quadrant pain: Secondary | ICD-10-CM | POA: Diagnosis present

## 2023-07-02 LAB — POCT INFLUENZA A/B
Influenza A, POC: NEGATIVE
Influenza B, POC: NEGATIVE

## 2023-07-02 LAB — POCT URINALYSIS DIP (MANUAL ENTRY)
Bilirubin, UA: NEGATIVE
Glucose, UA: NEGATIVE mg/dL
Ketones, POC UA: NEGATIVE mg/dL
Leukocytes, UA: NEGATIVE
Nitrite, UA: NEGATIVE
Protein Ur, POC: NEGATIVE mg/dL
Spec Grav, UA: <=1.005 — AB (ref 1.010–1.025)
Urobilinogen, UA: 0.2 E.U./dL
pH, UA: 6 (ref 5.0–8.0)

## 2023-07-02 MED ORDER — PREDNISONE 20 MG PO TABS: 20.0000 mg | ORAL_TABLET | Freq: Every day | ORAL | 0 refills | Status: DC

## 2023-07-02 MED ORDER — ALBUTEROL SULFATE HFA 108 (90 BASE) MCG/ACT IN AERS: 1.0000 | INHALATION_SPRAY | Freq: Four times a day (QID) | RESPIRATORY_TRACT | 0 refills | Status: DC | PRN

## 2023-07-02 NOTE — ED Triage Notes (Addendum)
Pt c/o chills and SOB onset last night. Pressure in the chest.   Pt has a hx of pneumonia and states she feels like she did the last time she was diagnosed with it.

## 2023-07-02 NOTE — Discharge Instructions (Addendum)
Urine was clear.  Vaginal swab and COVID test is pending.  I have sent you a low-dose of prednisone and albuterol inhaler to help with current symptoms as I do think that symptoms are viral.  Please follow-up if any symptoms persist or worsen.

## 2023-07-02 NOTE — ED Provider Notes (Signed)
[facility]    CSN: [id number] Arrival date & time: 07/02/23  1834      History   Chief Complaint No chief complaint on file.   HPI Alexa Gallagher is a 42 y.o. female.   Patient presents with chills and shortness of breath that started yesterday.  Denies any associated upper respiratory symptoms, cough, fever at home.  Denies any known sick contacts.  States that she has had pneumonia previously and this feels similar.  She does report that she has asthma but has not had any complications in a while.  She used an albuterol inhaler today that was someone's elses with temporary improvement in symptoms. Denies chest pain.   Patient also reporting that she has some very mild right lower abdominal pain yesterday that is now resolved.  Denies dysuria, urinary frequency, vaginal discharge, hematuria, vaginal bleeding.  Last menstrual cycle was 06/05/2023.  Denies exposure to STD.     Past Medical History:  Diagnosis Date   Abnormal Pap smear    Anxiety    Diverticulitis 11/20/2017   History of gallstones    Incisional hernia 08/2013   Major depression    Overactive bladder    Pneumonia 03/2008   Sigmoid diverticulosis 10/2017    Patient Active Problem List   Diagnosis Date Noted   Major depressive disorder, recurrent episode, mild with anxious distress (HCC) 07/01/2016   Active labor 02/06/2014   Incisional hernia, without obstruction or gangrene 08/05/2013   Supervision of other normal pregnancy 08/09/2012    Past Surgical History:  Procedure Laterality Date   CHOLECYSTECTOMY     COLPOSCOPY VULVA W/ BIOPSY     abnormal pap.   HERNIA REPAIR     LEEP N/A 01/12/2019   Procedure: LOOP ELECTROSURGICAL EXCISION PROCEDURE (LEEP);  Surgeon: Philip Aspen, DO;  Location: [facility];  Service: Gynecology;  Laterality: N/A;    OB History     Gravida  3   Para  3   Term  3   Preterm  0   AB  0   Living  3      SAB  0   IAB  0    Ectopic  0   Multiple  0   Live Births  3            Home Medications    Prior to Admission medications   Medication Sig Start Date End Date Taking? Authorizing Provider  albuterol (VENTOLIN HFA) 108 (90 Base) MCG/ACT inhaler Inhale 1-2 puffs into the lungs every 6 (six) hours as needed for wheezing or shortness of breath. 07/02/23  Yes Kenna Kirn, Rolly Salter E, FNP  predniSONE (DELTASONE) 20 MG tablet Take 1 tablet (20 mg total) by mouth daily for 5 days. 07/02/23 07/07/23 Yes Luetta Piazza, Acie Fredrickson, FNP  Vitamin D, Ergocalciferol, (DRISDOL) 1.25 MG (50000 UNIT) CAPS capsule Take 50,000 Units by mouth once a week. 04/17/23  Yes [provider]  Biotin 56387 MCG TABS Take by mouth.    [provider]  buPROPion (WELLBUTRIN) 100 MG tablet Take 100 mg by mouth 2 (two) times daily.    [provider]  Cholecalciferol (VITAMIN D3 PO) Take 250 mg by mouth.    [provider]  cyclobenzaprine (FLEXERIL) 10 MG tablet Take 1 tablet (10 mg total) by mouth 2 (two) times daily as needed for muscle spasms. 07/25/20   Lorelee New, PA-C  ibuprofen (ADVIL) 600 MG tablet Take 1 tablet (600 mg total) by  mouth every 6 (six) hours as needed for moderate pain. 07/25/20   Lorelee New, PA-C  vitamin C (ASCORBIC ACID) 250 MG tablet Take 500 mg by mouth daily.    [provider]    Family History Family History  Problem Relation Age of Onset   Cancer Mother        cervical cancer   Anxiety disorder Mother    Hypertension Father    Diabetes Father        boreline   Alcohol abuse Maternal Grandmother    Depression Maternal Grandmother    Anxiety disorder Maternal Grandmother    Heart disease Paternal Grandmother    Sickle cell anemia Paternal Grandfather    Anesthesia problems Neg Hx     Social History Social History   Tobacco Use   Smoking status: Never   Smokeless tobacco: Never  Vaping Use   Vaping status: Never Used  Substance Use Topics   Alcohol use: Yes     Comment: occ   Drug use: No     Allergies   Patient has no known allergies.   Review of Systems Review of Systems Per HPI  Physical Exam Triage Vital Signs ED Triage Vitals  Encounter Vitals Group     BP 07/02/23 1846 123/86     Systolic BP Percentile --      Diastolic BP Percentile --      Pulse Rate 07/02/23 1846 96     Resp 07/02/23 1846 13     Temp 07/02/23 1846 100.1 F (37.8 C)     Temp Source 07/02/23 1846 Oral     SpO2 07/02/23 1846 98 %     Weight --      Height --      Head Circumference --      Peak Flow --      Pain Score 07/02/23 1849 5     Pain Loc --      Pain Education --      Exclude from Growth Chart --    No data found.  Updated Vital Signs BP 123/86 (BP Location: Left Arm)   Pulse 96   Temp 100.1 F (37.8 C) (Oral)   Resp 13   LMP 06/25/2023 (Exact Date)   SpO2 98%   Visual Acuity Right Eye Distance:   Left Eye Distance:   Bilateral Distance:    Right Eye Near:   Left Eye Near:    Bilateral Near:     Physical Exam Constitutional:      General: She is not in acute distress.    Appearance: Normal appearance. She is not toxic-appearing or diaphoretic.  HENT:     Head: Normocephalic and atraumatic.  Eyes:     Extraocular Movements: Extraocular movements intact.     Conjunctiva/sclera: Conjunctivae normal.  Cardiovascular:     Rate and Rhythm: Normal rate and regular rhythm.     Pulses: Normal pulses.     Heart sounds: Normal heart sounds.  Pulmonary:     Effort: Pulmonary effort is normal. No respiratory distress.     Breath sounds: Normal breath sounds. No stridor. No wheezing, rhonchi or rales.  Abdominal:     General: Bowel sounds are normal. There is no distension.     Palpations: Abdomen is soft.     Tenderness: There is no abdominal tenderness.  Genitourinary:    Comments: Deferred with shared decision making.  Self swab performed. Neurological:     General: No focal deficit present.  Mental Status: She is  alert and oriented to person, place, and time. Mental status is at baseline.  Psychiatric:        Mood and Affect: Mood normal.        Behavior: Behavior normal.        Thought Content: Thought content normal.        Judgment: Judgment normal.      UC Treatments / Results  Labs (all labs ordered are listed, but only abnormal results are displayed) Labs Reviewed  POCT URINALYSIS DIP (MANUAL ENTRY) - Abnormal; Notable for the following components:      Result Value   Spec Grav, UA <=1.005 (*)    Blood, UA trace-intact (*)    All other components within normal limits  SARS CORONAVIRUS 2 (TAT 6-24 HRS)  POCT INFLUENZA A/B  CERVICOVAGINAL ANCILLARY ONLY    EKG   Radiology DG Chest 2 View  Result Date: 07/02/2023 CLINICAL DATA:  Shortness of breath EXAM: CHEST - 2 VIEW COMPARISON:  07/02/2015 FINDINGS: The heart size and mediastinal contours are within normal limits. Both lungs are clear. Mild scoliosis. IMPRESSION: No active cardiopulmonary disease. Electronically Signed   By: Jasmine Pang M.D.   On: 07/02/2023 19:15    Procedures Procedures (including critical care time)  Medications Ordered in UC Medications - No data to display  Initial Impression / Assessment and Plan / UC Course  I have reviewed the triage vital signs and the nursing notes.  Pertinent labs & imaging results that were available during my care of the patient were reviewed by me and considered in my medical decision making (see chart for details).     Chest x-ray completed that was negative for any acute cardiopulmonary process.  Highly suspect some form of viral illness given low-grade temp, chills, shortness of breath.  Rapid flu was negative.  COVID test pending.  Patient is not in any acute distress and oxygen is normal so do not think that emergent evaluation is necessary.  Patient is not having any chest pain and symptoms do not appear to be cardiac related so EKG was deferred.  Patient does report  some right lower abdominal pain that was present yesterday that is now resolved but do not suspect this is related to current symptoms.  Although, UA was completed that was unremarkable.  Urine pregnancy test deferred given patient just completed her menstrual cycle.  Patient requesting STD testing so cervicovaginal swab is pending as well.  Will prescribe patient an albuterol inhaler to take as needed for shortness of breath and low-dose steroid burst to decrease inflammation in chest to see if those will help with symptoms.  Advised strict return and ER precautions.  Patient verbalized understanding and was agreeable with plan. Final Clinical Impressions(s) / UC Diagnoses   Final diagnoses:  Viral illness  Shortness of breath  Screening examination for venereal disease     Discharge Instructions      Urine was clear.  Vaginal swab and COVID test is pending.  I have sent you a low-dose of prednisone and albuterol inhaler to help with current symptoms as I do think that symptoms are viral.  Please follow-up if any symptoms persist or worsen.     ED Prescriptions     Medication Sig Dispense Auth. Provider   albuterol (VENTOLIN HFA) 108 (90 Base) MCG/ACT inhaler Inhale 1-2 puffs into the lungs every 6 (six) hours as needed for wheezing or shortness of breath. 1 each [place], Acie Fredrickson, [place]  predniSONE (DELTASONE) 20 MG tablet Take 1 tablet (20 mg total) by mouth daily for 5 days. 5 tablet Russell, Acie Fredrickson, [place]      PDMP not reviewed this encounter.   Gustavus Bryant, [place] 07/02/23 2003

## 2023-07-03 ENCOUNTER — Telehealth: Payer: Self-pay

## 2023-07-03 ENCOUNTER — Other Ambulatory Visit: Payer: Self-pay

## 2023-07-03 MED ORDER — CLINDAMYCIN PHOSPHATE 2 % VA CREA: 1.0000 | TOPICAL_CREAM | Freq: Every day | VAGINAL | Status: DC

## 2023-07-03 MED ORDER — PREDNISONE 20 MG PO TABS: 20.0000 mg | ORAL_TABLET | Freq: Every day | ORAL | 0 refills | Status: AC

## 2023-07-03 MED ORDER — ALBUTEROL SULFATE HFA 108 (90 BASE) MCG/ACT IN AERS: 1.0000 | INHALATION_SPRAY | Freq: Four times a day (QID) | RESPIRATORY_TRACT | 0 refills | Status: AC | PRN

## 2023-07-03 NOTE — Telephone Encounter (Signed)
Pt called and said her pharmacy did not have her prescriptions.  Rx were sent to wrong pharmacy,  resent to pharmacy of her choice.

## 2023-07-03 NOTE — Telephone Encounter (Signed)
Pt requires tx with clindamycin cream 2% per protocol. Reviewed with patient, verified pharmacy, prescription sent

## 2023-07-04 MED ORDER — CLINDAMYCIN PHOSPHATE 2 % VA CREA: 1.0000 | TOPICAL_CREAM | Freq: Every day | VAGINAL | 0 refills | Status: AC

## 2023-07-04 NOTE — Telephone Encounter (Signed)
Pt called stating one of the medications were sent to correct pharmacy, but the vaginal medication was not. Provider sending in correct med at this time.

## 2023-07-04 NOTE — Telephone Encounter (Signed)
Patient needed rx for clindamycin sent to pharm.

## 2023-12-10 ENCOUNTER — Ambulatory Visit: Payer: BC Managed Care – PPO

## 2024-06-17 ENCOUNTER — Encounter: Payer: Self-pay | Admitting: Advanced Practice Midwife
